# Patient Record
Sex: Male | Born: 1990 | Race: White | Hispanic: No | Smoking: Former smoker
Health system: Southern US, Community
[De-identification: ages and names within clinical notes are randomized; demographics above are authoritative.]

## PROBLEM LIST (undated history)

## (undated) DIAGNOSIS — Z9889 Other specified postprocedural states: Secondary | ICD-10-CM

## (undated) DIAGNOSIS — K589 Irritable bowel syndrome without diarrhea: Secondary | ICD-10-CM

## (undated) DIAGNOSIS — T8859XA Other complications of anesthesia, initial encounter: Secondary | ICD-10-CM

## (undated) DIAGNOSIS — F845 Asperger's syndrome: Secondary | ICD-10-CM

## (undated) DIAGNOSIS — S060XAA Concussion with loss of consciousness status unknown, initial encounter: Secondary | ICD-10-CM

## (undated) DIAGNOSIS — F419 Anxiety disorder, unspecified: Secondary | ICD-10-CM

## (undated) DIAGNOSIS — S060X9A Concussion with loss of consciousness of unspecified duration, initial encounter: Secondary | ICD-10-CM

## (undated) DIAGNOSIS — K5090 Crohn's disease, unspecified, without complications: Secondary | ICD-10-CM

## (undated) HISTORY — PX: TONSILLECTOMY: SUR1361

---
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
## 2012-09-17 ENCOUNTER — Emergency Department (HOSPITAL_BASED_OUTPATIENT_CLINIC_OR_DEPARTMENT_OTHER): Payer: Worker's Compensation

## 2012-09-17 ENCOUNTER — Encounter (HOSPITAL_BASED_OUTPATIENT_CLINIC_OR_DEPARTMENT_OTHER): Payer: Self-pay | Admitting: *Deleted

## 2012-09-17 ENCOUNTER — Emergency Department (HOSPITAL_BASED_OUTPATIENT_CLINIC_OR_DEPARTMENT_OTHER)
Admission: EM | Admit: 2012-09-17 | Discharge: 2012-09-17 | Disposition: A | Discharge status: Home / Self Care | Payer: Worker's Compensation | Attending: Emergency Medicine | Admitting: Emergency Medicine

## 2012-09-17 DIAGNOSIS — W010XXA Fall on same level from slipping, tripping and stumbling without subsequent striking against object, initial encounter: Secondary | ICD-10-CM | POA: Insufficient documentation

## 2012-09-17 DIAGNOSIS — S5000XA Contusion of unspecified elbow, initial encounter: Secondary | ICD-10-CM | POA: Insufficient documentation

## 2012-09-17 DIAGNOSIS — Z8719 Personal history of other diseases of the digestive system: Secondary | ICD-10-CM | POA: Insufficient documentation

## 2012-09-17 DIAGNOSIS — F17200 Nicotine dependence, unspecified, uncomplicated: Secondary | ICD-10-CM | POA: Insufficient documentation

## 2012-09-17 DIAGNOSIS — Y9302 Activity, running: Secondary | ICD-10-CM | POA: Insufficient documentation

## 2012-09-17 DIAGNOSIS — T148XXA Other injury of unspecified body region, initial encounter: Secondary | ICD-10-CM

## 2012-09-17 DIAGNOSIS — IMO0002 Reserved for concepts with insufficient information to code with codable children: Secondary | ICD-10-CM | POA: Insufficient documentation

## 2012-09-17 DIAGNOSIS — Y9289 Other specified places as the place of occurrence of the external cause: Secondary | ICD-10-CM | POA: Insufficient documentation

## 2012-09-17 HISTORY — DX: Crohn's disease, unspecified, without complications: K50.90

## 2012-09-17 MED ORDER — HYDROCODONE-ACETAMINOPHEN 5-325 MG PO TABS: 2.0000 | ORAL_TABLET | ORAL | Status: DC | PRN

## 2012-09-17 NOTE — ED Notes (Signed)
Patient transported to X-ray 

## 2012-09-17 NOTE — ED Notes (Signed)
Pt c/o fall on concrete injury left elbow and head injury x 1 hr ago

## 2012-09-17 NOTE — ED Provider Notes (Signed)
History     CSN: [id number]  Arrival date & time 09/17/12  [place]   First MD Initiated Contact with Patient 09/17/12 1903      Chief Complaint  Patient presents with  . Fall    (Consider location/radiation/quality/duration/timing/severity/associated sxs/prior treatment) HPI Comments: Patient presents to the ER for evaluation of left elbow injury. Patient reports that he was running after a dog, slipped and fell on concrete. He is complaining of pain in the elbow, especially with movement. Pain is moderate to severe. He denied his head but there was no loss of consciousness. Patient denies any numbness or tingling. There is no neck or back pain.  Patient is a 22 y.o. male presenting with fall.  Fall    Past Medical History  Diagnosis Date  . Crohn disease     Past Surgical History  Procedure Laterality Date  . Tonsillectomy      History reviewed. No pertinent family history.  History  Substance Use Topics  . Smoking status: Current Every Day Smoker -- 0.50 packs/day    Types: Cigarettes  . Smokeless tobacco: Not on file  . Alcohol Use: No      Review of Systems  HENT: Negative for neck pain.   Musculoskeletal: Positive for arthralgias. Negative for back pain.  Skin: Positive for wound.  Neurological: Negative.   All other systems reviewed and are negative.    Allergies  Review of patient's allergies indicates no known allergies.  Home Medications   Current Outpatient Rx  Name  Route  Sig  Dispense  Refill  . montelukast (SINGULAIR) 10 MG tablet   Oral   Take 10 mg by mouth at bedtime.           BP 140/89  Pulse 76  Temp(Src) 99.9 F (37.7 C) (Oral)  Resp 16  Ht 6\' 3"  (1.905 m)  Wt 255 lb (115.667 kg)  BMI 31.87 kg/m2  SpO2 99%  Physical Exam  Constitutional: He is oriented to person, place, and time. He appears well-developed and well-nourished. No distress.  HENT:  Head: Normocephalic and atraumatic.  Right Ear: Hearing normal.  Left  Ear: Hearing normal.  Nose: Nose normal.  Mouth/Throat: Oropharynx is clear and moist and mucous membranes are normal.  Eyes: Conjunctivae and EOM are normal. Pupils are equal, round, and reactive to light.  Neck: Normal range of motion. Neck supple.  Cardiovascular: Regular rhythm, S1 normal and S2 normal.  Exam reveals no gallop and no friction rub.   No murmur heard. Pulmonary/Chest: Effort normal and breath sounds normal. No respiratory distress. He exhibits no tenderness.  Abdominal: Soft. Normal appearance and bowel sounds are normal. There is no hepatosplenomegaly. There is no tenderness. There is no rebound, no guarding, no tenderness at McBurney's point and negative Murphy's sign. No hernia.  Musculoskeletal:       Left elbow: He exhibits decreased range of motion and swelling. He exhibits no effusion, no deformity and no laceration. Tenderness found. Lateral epicondyle and olecranon process tenderness noted.  Neurological: He is alert and oriented to person, place, and time. He has normal strength. No cranial nerve deficit or sensory deficit. Coordination normal. GCS eye subscore is 4. GCS verbal subscore is 5. GCS motor subscore is 6.  Skin: Skin is warm, dry and intact. No rash noted. No cyanosis.     Psychiatric: He has a normal mood and affect. His speech is normal and behavior is normal. Thought content normal.    ED Course  Procedures (  including critical care time)  Labs Reviewed - No data to display Dg Elbow Complete Left  09/17/2012  *RADIOLOGY REPORT*  Clinical Data: Trauma, left elbow pain  LEFT ELBOW - COMPLETE 3+ VIEW  Comparison: None.  Findings: No fracture or dislocation.  No soft tissue abnormality. No radiopaque foreign body.  No joint effusion.  IMPRESSION: No fracture identified.   Original Report Authenticated By: Christiana Pellant, M.D.      Diagnosis: Abrasion and contusion left elbow    MDM  Patient presents to the ER for evaluation of left elbow injury.  He does have a very superficial small abrasion just distal to the elbow. Patient is complaining of pain in this region and in the elbow region, especially movement. No deformity noted. X-ray did not show any fracture, dislocation. There is no joint effusion. Patient counseled that if he does not have any improvement in 1 week, should return to see his doctor for repeat x-ray. Otherwise will treat with rest and analgesia.        Gilda Crease, MD 09/17/12 2018

## 2014-05-18 ENCOUNTER — Encounter: Payer: Self-pay | Admitting: Physician Assistant

## 2014-05-18 ENCOUNTER — Ambulatory Visit (INDEPENDENT_AMBULATORY_CARE_PROVIDER_SITE_OTHER): Payer: Managed Care, Other (non HMO) | Admitting: Emergency Medicine

## 2014-05-18 VITALS — BP 120/68 | HR 54 | Temp 98.3°F | Resp 16 | Ht 74.0 in | Wt 197.0 lb

## 2014-05-18 DIAGNOSIS — R001 Bradycardia, unspecified: Secondary | ICD-10-CM

## 2014-05-18 DIAGNOSIS — R634 Abnormal weight loss: Secondary | ICD-10-CM

## 2014-05-18 DIAGNOSIS — R55 Syncope and collapse: Secondary | ICD-10-CM

## 2014-05-18 LAB — COMPREHENSIVE METABOLIC PANEL
ALBUMIN: 5.1 g/dL (ref 3.5–5.2)
ALT: 20 U/L (ref 0–53)
AST: 19 U/L (ref 0–37)
Alkaline Phosphatase: 53 U/L (ref 39–117)
BILIRUBIN TOTAL: 1.4 mg/dL — AB (ref 0.2–1.2)
BUN: 15 mg/dL (ref 6–23)
CALCIUM: 10.3 mg/dL (ref 8.4–10.5)
CO2: 30 meq/L (ref 19–32)
CREATININE: 0.93 mg/dL (ref 0.50–1.35)
Chloride: 105 mEq/L (ref 96–112)
Glucose, Bld: 83 mg/dL (ref 70–99)
Potassium: 4 mEq/L (ref 3.5–5.3)
Sodium: 143 mEq/L (ref 135–145)
TOTAL PROTEIN: 7.6 g/dL (ref 6.0–8.3)

## 2014-05-18 LAB — POCT CBC
Granulocyte percent: 60.3 %G (ref 37–80)
HCT, POC: 45.7 % (ref 43.5–53.7)
HEMOGLOBIN: 15.4 g/dL (ref 14.1–18.1)
Lymph, poc: 1.9 (ref 0.6–3.4)
MCH, POC: 31 pg (ref 27–31.2)
MCHC: 33.7 g/dL (ref 31.8–35.4)
MCV: 91.9 fL (ref 80–97)
MID (cbc): 0.4 (ref 0–0.9)
MPV: 6.8 fL (ref 0–99.8)
PLATELET COUNT, POC: 229 10*3/uL (ref 142–424)
POC Granulocyte: 3.4 (ref 2–6.9)
POC LYMPH PERCENT: 33.2 %L (ref 10–50)
POC MID %: 6.5 % (ref 0–12)
RBC: 4.98 M/uL (ref 4.69–6.13)
RDW, POC: 12.6 %
WBC: 5.6 10*3/uL (ref 4.6–10.2)

## 2014-05-18 LAB — POCT URINALYSIS DIPSTICK
Bilirubin, UA: NEGATIVE
Blood, UA: NEGATIVE
Glucose, UA: NEGATIVE
Leukocytes, UA: NEGATIVE
Nitrite, UA: NEGATIVE
PH UA: 5.5
PROTEIN UA: NEGATIVE
UROBILINOGEN UA: 0.2

## 2014-05-18 LAB — POCT GLYCOSYLATED HEMOGLOBIN (HGB A1C): HEMOGLOBIN A1C: 4.6

## 2014-05-18 LAB — GLUCOSE, POCT (MANUAL RESULT ENTRY): POC Glucose: 89 mg/dl (ref 70–99)

## 2014-05-18 LAB — TSH: TSH: 0.799 u[IU]/mL (ref 0.350–4.500)

## 2014-05-18 NOTE — Progress Notes (Signed)
IDENTIFYING INFORMATION  Theodore Meza / DOB: 01/30/1991 / MRN: [id number]  The patient  does not have a problem list on file.  SUBJECTIVE  CC: Bradycardia   HPI: [name] is a 24 y.o. y.o. male presenting for a workers comp injury. It was learned during that interview that he has passed out roughly 6 times in the last six months. Three of which occurred while he was working, and three while he was at home. He reports this is new for him.  He descbies the episodes as "seeing a bright light" and then starts to become dizzy, which last roughly for one minute, and then he passes out.  He timed one episode and reports a 10 min blackout. He reports these episodes are usually precipitated by sweating caused by physical activity. He denies chest pain with the episodes.  He denies merciful syncope and hx of panic attack, and does not feel anxious before these episode.  He also reports an unintentional weight loss 57 lbs in the last year.  He has a history of stomach problems which led to a negative colonoscopy.  He denies hematemesis. he denies participating in a physical activity program at this time. He denies nausea and vomiting at the time. He reports drinking greater than 64 ounces of fluid daily.    He reports daily 5/10 abdominal cramplike abdominal pain.  He moves his bowels daily and reports diarrhea most of the time. If he does not have diarrhea then he will be constipated.  The last time he had no abdominal pain was for a few days in November.  He reports a negative GI workup in the past that included "a whole bunch of tests." That information is not documented in epic.     He  has a past medical history of Crohn disease.    He currently has no medications in their medication list.  [name] is allergic to dicyclomine. He  reports that he has been smoking Cigarettes.  He has a 1 pack-year smoking history. He does not have any smokeless tobacco history on file. He reports that he does not drink  alcohol or use illicit drugs. He  reports that he does not engage in sexual activity.  The patient  has past surgical history that includes Tonsillectomy.  His family history is not on file.  Review of Systems  Constitutional: Positive for weight loss. Negative for fever, chills, malaise/fatigue and diaphoresis.  Respiratory: Negative for cough and shortness of breath.   Cardiovascular: Negative for chest pain and palpitations.  Gastrointestinal: Positive for nausea (with episodic syncope), abdominal pain (chronic), diarrhea and constipation.  Genitourinary: Negative.   Skin: Negative.   Neurological: Negative for dizziness, weakness and headaches.    OBJECTIVE  Blood pressure 120/68, pulse 54, temperature 98.3 F (36.8 C), resp. rate 16, height  (1.88 m), weight 197 lb (89.359 kg), SpO2 100 %. The patient's body mass index is 25.28 kg/(m^2).  Physical Exam  Constitutional: He is oriented to person, place, and time. He appears well-developed and well-nourished.  Neck: No thyromegaly present.  Cardiovascular: Normal pulses.  A regularly irregular rhythm present.  No extrasystoles are present. Bradycardia present.   No murmur heard. Respiratory: Breath sounds normal. He has no decreased breath sounds. He has no wheezes. He has no rhonchi. He has no rales.  GI: Soft. Bowel sounds are normal.  Musculoskeletal: Normal range of motion.  Neurological: He is alert and oriented to person, place, and time. He  has normal strength. He displays no atrophy, no tremor and normal reflexes. No cranial nerve deficit or sensory deficit. He exhibits normal muscle tone. He displays no seizure activity. Coordination and gait normal.  Skin: Skin is warm and dry. No pallor.  Psychiatric: He has a normal mood and affect. His behavior is normal. Judgment and thought content normal.   Orthostatic VS for the past 24 hrs:  BP- Lying Pulse- Lying BP- Sitting Pulse- Sitting BP- Standing at 0 minutes Pulse-  Standing at 0 minutes  05/18/14 1355 129/78 mmHg 60 121/77 mmHg 63 121/77 mmHg 69   Results for orders placed or performed in visit on 05/18/14 (from the past 24 hour(s))  POCT CBC     Status: None   Collection Time: 05/18/14  1:54 PM  Result Value Ref Range   WBC 5.6 4.6 - 10.2 K/uL   Lymph, poc 1.9 0.6 - 3.4   POC LYMPH PERCENT 33.2 10 - 50 %L   MID (cbc) 0.4 0 - 0.9   POC MID % 6.5 0 - 12 %M   POC Granulocyte 3.4 2 - 6.9   Granulocyte percent 60.3 37 - 80 %G   RBC 4.98 4.69 - 6.13 M/uL   Hemoglobin 15.4 14.1 - 18.1 g/dL   HCT, POC 40.945.7 81.143.5 - 53.7 %   MCV 91.9 80 - 97 fL   MCH, POC 31.0 27 - 31.2 pg   MCHC 33.7 31.8 - 35.4 g/dL   RDW, POC 91.412.6 %   Platelet Count, POC 229 142 - 424 K/uL   MPV 6.8 0 - 99.8 fL  POCT glucose (manual entry)     Status: None   Collection Time: 05/18/14  1:54 PM  Result Value Ref Range   POC Glucose 89 70 - 99 mg/dl  POCT glycosylated hemoglobin (Hb A1C)     Status: None   Collection Time: 05/18/14  1:54 PM  Result Value Ref Range   Hemoglobin A1C 4.6   POCT urinalysis dipstick     Status: None   Collection Time: 05/18/14  2:17 PM  Result Value Ref Range   Color, UA Yellow    Clarity, UA Clear    Glucose, UA neg    Bilirubin, UA neg    Ketones, UA trace    Spec Grav, UA >=1.030    Blood, UA neg    pH, UA 5.5    Protein, UA neg    Urobilinogen, UA 0.2    Nitrite, UA neg    Leukocytes, UA Negative     ASSESSMENT & PLAN  Theodore Meza was seen today for bradycardia.  Diagnoses and associated orders for this visit:  Bradycardia - TSH - EKG 12-Lead - Ambulatory referral to Cardiology  Syncope and collapse - POCT CBC - POCT glucose (manual entry) - POCT glycosylated hemoglobin (Hb A1C) - POCT urinalysis dipstick - Comprehensive metabolic panel - EKG 12-Lead - Ambulatory referral to Cardiology  Unintentional weight loss - HIV antibody     The patient was instructed to to call or comeback to clinic as needed, or should symptoms  warrant.  Theodore Meza, MHS, PA-C [facility] 05/18/2014 3:09 PM

## 2014-05-19 LAB — HIV ANTIBODY (ROUTINE TESTING W REFLEX): HIV 1&2 Ab, 4th Generation: NONREACTIVE

## 2015-01-11 ENCOUNTER — Emergency Department (HOSPITAL_BASED_OUTPATIENT_CLINIC_OR_DEPARTMENT_OTHER): Payer: Worker's Compensation

## 2015-01-11 ENCOUNTER — Emergency Department (HOSPITAL_BASED_OUTPATIENT_CLINIC_OR_DEPARTMENT_OTHER)
Admission: EM | Admit: 2015-01-11 | Discharge: 2015-01-11 | Disposition: A | Discharge status: Home / Self Care | Payer: Worker's Compensation | Attending: Emergency Medicine | Admitting: Emergency Medicine

## 2015-01-11 ENCOUNTER — Encounter (HOSPITAL_BASED_OUTPATIENT_CLINIC_OR_DEPARTMENT_OTHER): Payer: Self-pay | Admitting: *Deleted

## 2015-01-11 DIAGNOSIS — Y9289 Other specified places as the place of occurrence of the external cause: Secondary | ICD-10-CM | POA: Insufficient documentation

## 2015-01-11 DIAGNOSIS — Y9389 Activity, other specified: Secondary | ICD-10-CM | POA: Diagnosis not present

## 2015-01-11 DIAGNOSIS — Z720 Tobacco use: Secondary | ICD-10-CM | POA: Diagnosis not present

## 2015-01-11 DIAGNOSIS — Z8719 Personal history of other diseases of the digestive system: Secondary | ICD-10-CM | POA: Insufficient documentation

## 2015-01-11 DIAGNOSIS — Y990 Civilian activity done for income or pay: Secondary | ICD-10-CM | POA: Insufficient documentation

## 2015-01-11 DIAGNOSIS — S060X0A Concussion without loss of consciousness, initial encounter: Secondary | ICD-10-CM | POA: Diagnosis not present

## 2015-01-11 DIAGNOSIS — S0990XA Unspecified injury of head, initial encounter: Secondary | ICD-10-CM | POA: Diagnosis present

## 2015-01-11 DIAGNOSIS — W548XXA Other contact with dog, initial encounter: Secondary | ICD-10-CM | POA: Diagnosis not present

## 2015-01-11 HISTORY — DX: Concussion with loss of consciousness of unspecified duration, initial encounter: S06.0X9A

## 2015-01-11 HISTORY — DX: Concussion with loss of consciousness status unknown, initial encounter: S06.0XAA

## 2015-01-11 NOTE — Discharge Instructions (Signed)
Tylenol or ibuprofen as needed for headache.  Return to the emergency department if you experience any new or concerning symptoms.   Concussion A concussion, or closed-head injury, is a brain injury caused by a direct blow to the head or by a quick and sudden movement (jolt) of the head or neck. Concussions are usually not life-threatening. Even so, the effects of a concussion can be serious. If you have had a concussion before, you are more likely to experience concussion-like symptoms after a direct blow to the head.  CAUSES  Direct blow to the head, such as from running into another player during a soccer game, being hit in a fight, or hitting your head on a hard surface.  A jolt of the head or neck that causes the brain to move back and forth inside the skull, such as in a car crash. SIGNS AND SYMPTOMS The signs of a concussion can be hard to notice. Early on, they may be missed by you, family members, and health care providers. You may look fine but act or feel differently. Symptoms are usually temporary, but they may last for days, weeks, or even longer. Some symptoms may appear right away while others may not show up for hours or days. Every head injury is different. Symptoms include:  Mild to moderate headaches that will not go away.  A feeling of pressure inside your head.  Having more trouble than usual:  Learning or remembering things you have heard.  Answering questions.  Paying attention or concentrating.  Organizing daily tasks.  Making decisions and solving problems.  Slowness in thinking, acting or reacting, speaking, or reading.  Getting lost or being easily confused.  Feeling tired all the time or lacking energy (fatigued).  Feeling drowsy.  Sleep disturbances.  Sleeping more than usual.  Sleeping less than usual.  Trouble falling asleep.  Trouble sleeping (insomnia).  Loss of balance or feeling lightheaded or dizzy.  Nausea or  vomiting.  Numbness or tingling.  Increased sensitivity to:  Sounds.  Lights.  Distractions.  Vision problems or eyes that tire easily.  Diminished sense of taste or smell.  Ringing in the ears.  Mood changes such as feeling sad or anxious.  Becoming easily irritated or angry for little or no reason.  Lack of motivation.  Seeing or hearing things other people do not see or hear (hallucinations). DIAGNOSIS Your health care provider can usually diagnose a concussion based on a description of your injury and symptoms. He or she will ask whether you passed out (lost consciousness) and whether you are having trouble remembering events that happened right before and during your injury. Your evaluation might include:  A brain scan to look for signs of injury to the brain. Even if the test shows no injury, you may still have a concussion.  Blood tests to be sure other problems are not present. TREATMENT  Concussions are usually treated in an emergency department, in urgent care, or at a clinic. You may need to stay in the hospital overnight for further treatment.  Tell your health care provider if you are taking any medicines, including prescription medicines, over-the-counter medicines, and natural remedies. Some medicines, such as blood thinners (anticoagulants) and aspirin, may increase the chance of complications. Also tell your health care provider whether you have had alcohol or are taking illegal drugs. This information may affect treatment.  Your health care provider will send you home with important instructions to follow.  How fast you will recover  from a concussion depends on many factors. These factors include how severe your concussion is, what part of your brain was injured, your age, and how healthy you were before the concussion.  Most people with mild injuries recover fully. Recovery can take time. In general, recovery is slower in older persons. Also, persons who  have had a concussion in the past or have other medical problems may find that it takes longer to recover from their current injury. HOME CARE INSTRUCTIONS General Instructions  Carefully follow the directions your health care provider gave you.  Only take over-the-counter or prescription medicines for pain, discomfort, or fever as directed by your health care provider.  Take only those medicines that your health care provider has approved.  Do not drink alcohol until your health care provider says you are well enough to do so. Alcohol and certain other drugs may slow your recovery and can put you at risk of further injury.  If it is harder than usual to remember things, write them down.  If you are easily distracted, try to do one thing at a time. For example, do not try to watch TV while fixing dinner.  Talk with family members or close friends when making important decisions.  Keep all follow-up appointments. Repeated evaluation of your symptoms is recommended for your recovery.  Watch your symptoms and tell others to do the same. Complications sometimes occur after a concussion. Older adults with a brain injury may have a higher risk of serious complications, such as a blood clot on the brain.  Tell your teachers, school nurse, school counselor, coach, athletic trainer, or work Production designer, theatre/television/filmmanager about your injury, symptoms, and restrictions. Tell them about what you can or cannot do. They should watch for:  Increased problems with attention or concentration.  Increased difficulty remembering or learning new information.  Increased time needed to complete tasks or assignments.  Increased irritability or decreased ability to cope with stress.  Increased symptoms.  Rest. Rest helps the brain to heal. Make sure you:  Get plenty of sleep at night. Avoid staying up late at night.  Keep the same bedtime hours on weekends and weekdays.  Rest during the day. Take daytime naps or rest breaks  when you feel tired.  Limit activities that require a lot of thought or concentration. These include:  Doing homework or job-related work.  Watching TV.  Working on the computer.  Avoid any situation where there is potential for another head injury (football, hockey, soccer, basketball, martial arts, downhill snow sports and horseback riding). Your condition will get worse every time you experience a concussion. You should avoid these activities until you are evaluated by the appropriate follow-up health care providers. Returning To Your Regular Activities You will need to return to your normal activities slowly, not all at once. You must give your body and brain enough time for recovery.  Do not return to sports or other athletic activities until your health care provider tells you it is safe to do so.  Ask your health care provider when you can drive, ride a bicycle, or operate heavy machinery. Your ability to react may be slower after a brain injury. Never do these activities if you are dizzy.  Ask your health care provider about when you can return to work or school. Preventing Another Concussion It is very important to avoid another brain injury, especially before you have recovered. In rare cases, another injury can lead to permanent brain damage, brain swelling, or  death. The risk of this is greatest during the first 7-10 days after a head injury. Avoid injuries by:  Wearing a seat belt when riding in a car.  Drinking alcohol only in moderation.  Wearing a helmet when biking, skiing, skateboarding, skating, or doing similar activities.  Avoiding activities that could lead to a second concussion, such as contact or recreational sports, until your health care provider says it is okay.  Taking safety measures in your home.  Remove clutter and tripping hazards from floors and stairways.  Use grab bars in bathrooms and handrails by stairs.  Place non-slip mats on floors and in  bathtubs.  Improve lighting in dim areas. SEEK MEDICAL CARE IF:  You have increased problems paying attention or concentrating.  You have increased difficulty remembering or learning new information.  You need more time to complete tasks or assignments than before.  You have increased irritability or decreased ability to cope with stress.  You have more symptoms than before. Seek medical care if you have any of the following symptoms for more than 2 weeks after your injury:  Lasting (chronic) headaches.  Dizziness or balance problems.  Nausea.  Vision problems.  Increased sensitivity to noise or light.  Depression or mood swings.  Anxiety or irritability.  Memory problems.  Difficulty concentrating or paying attention.  Sleep problems.  Feeling tired all the time. SEEK IMMEDIATE MEDICAL CARE IF:  You have severe or worsening headaches. These may be a sign of a blood clot in the brain.  You have weakness (even if only in one hand, leg, or part of the face).  You have numbness.  You have decreased coordination.  You vomit repeatedly.  You have increased sleepiness.  One pupil is larger than the other.  You have convulsions.  You have slurred speech.  You have increased confusion. This may be a sign of a blood clot in the brain.  You have increased restlessness, agitation, or irritability.  You are unable to recognize people or places.  You have neck pain.  It is difficult to wake you up.  You have unusual behavior changes.  You lose consciousness. MAKE SURE YOU:  Understand these instructions.  Will watch your condition.  Will get help right away if you are not doing well or get worse. Document Released: 07/19/2003 Document Revised: 05/03/2013 Document Reviewed: 11/18/2012 [facility] Patient Information 2015 [place], [place]. This information is not intended to replace advice given to you by your health care provider. Make sure you discuss any  questions you have with your health care provider.

## 2015-01-11 NOTE — ED Notes (Signed)
Pt d/c home with note for school given
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
## 2016-08-04 DIAGNOSIS — F411 Generalized anxiety disorder: Secondary | ICD-10-CM | POA: Insufficient documentation

## 2019-03-15 ENCOUNTER — Encounter: Payer: Self-pay | Admitting: Adult Health

## 2019-03-15 ENCOUNTER — Other Ambulatory Visit: Payer: Self-pay

## 2019-03-15 ENCOUNTER — Ambulatory Visit (INDEPENDENT_AMBULATORY_CARE_PROVIDER_SITE_OTHER): Payer: 59 | Admitting: Adult Health

## 2019-03-15 DIAGNOSIS — F4310 Post-traumatic stress disorder, unspecified: Secondary | ICD-10-CM

## 2019-03-15 DIAGNOSIS — G4700 Insomnia, unspecified: Secondary | ICD-10-CM

## 2019-03-15 DIAGNOSIS — F411 Generalized anxiety disorder: Secondary | ICD-10-CM

## 2019-03-15 DIAGNOSIS — F331 Major depressive disorder, recurrent, moderate: Secondary | ICD-10-CM | POA: Diagnosis not present

## 2019-03-15 DIAGNOSIS — F429 Obsessive-compulsive disorder, unspecified: Secondary | ICD-10-CM | POA: Diagnosis not present

## 2019-03-15 MED ORDER — FLUOXETINE HCL 20 MG PO CAPS: 20.0000 mg | ORAL_CAPSULE | Freq: Every day | ORAL | 3 refills | Status: DC

## 2019-03-15 MED ORDER — HYDROXYZINE HCL 25 MG PO TABS: 25.0000 mg | ORAL_TABLET | Freq: Three times a day (TID) | ORAL | 2 refills | Status: AC | PRN

## 2019-03-15 NOTE — Progress Notes (Signed)
[facility] MD/PA/NP Initial Note  03/15/2019 4:48 PM Theodore Meza  MRN:  [id number]  Chief Complaint:   HPI:   Describes mood today as "so-so". Pleasant. Tearful at times. Moods "up and down" - constant worry and rumination with some periods of "normalcy". Mood symptoms - some depression and irritability. More anxious overall. Doesn't feel "passion or joy". Symptoms started several years ago. Struggles more some times than others. Trying to use therapeutic techniques to help manage it. Has taken Lexapro in the past but does not recall how it worked. Stable interest and motivation.Taking medications as prescribed.  Energy levels stable. Active, has a regular exercise routine. Walking 60 minutes a day. Works full-time as a IT consultant - x 4 years. Enjoys some usual interests and activities. Lives alone. Has friends. Father local but does not see often. Mostly by myself.  Appetite varies - nothing normal for 4 to 5 years - lost 100 pounds in 2015 - IBS. Weight stable. Sleeps better some nights than others. Easier to fall asleep and can't asleep. Has times when he has things on his mind. Worried - "worries about things I don't even have a reason too. Stating "it keeps me up a lot of nights". Averages 4 hours a night. Focus and concentration stable. Completing tasks. Managing aspects of household. Work going "really good". Feels like his mind is calmer at work. Stating "being productive keeps me from worrying so much".  Denies SI or HI. Denies AH or VH.  Previous medications: Lexapro  Visit Diagnosis:    ICD-10-CM   1. Insomnia, unspecified type  G47.00   2. Obsessive-compulsive disorder, unspecified type  F42.9   3. Generalized anxiety disorder  F41.1   4. Major depressive disorder, recurrent episode, moderate (HCC)  F33.1   5. PTSD (post-traumatic stress disorder)  F43.10     Past Psychiatric History: Denies psychiatric hospitalizations. Seen a therapist at age 28 after parents divorced - also had  some friends that has passed away.  Past Medical History:  Past Medical History:  Diagnosis Date  . Concussion   . Crohn disease [facility])     Past Surgical History:  Procedure Laterality Date  . TONSILLECTOMY      Family Psychiatric History: Father - BPD, Mother - uncertain  Family History: History reviewed. No pertinent family history.  Social History:  Social History   Socioeconomic History  . Marital status: Single    Spouse name: Not on file  . Number of children: Not on file  . Years of education: Not on file  . Highest education level: Not on file  Occupational History  . Not on file  Social Needs  . Financial resource strain: Not on file  . Food insecurity    Worry: Not on file    Inability: Not on file  . Transportation needs    Medical: Not on file    Non-medical: Not on file  Tobacco Use  . Smoking status: Former Smoker    Packs/day: 0.50    Years: 2.00    Pack years: 1.00    Types: Cigarettes  . Smokeless tobacco: Never Used  Substance and Sexual Activity  . Alcohol use: Not Currently    Alcohol/week: 0.0 standard drinks    Comment: 6/week  . Drug use: Not Currently  . Sexual activity: Not on file  Lifestyle  . Physical activity    Days per week: Not on file    Minutes per session: Not on file  . Stress: Not on file  Relationships  . Social Herbalist on phone: Not on file    Gets together: Not on file    Attends religious service: Not on file    Active member of club or organization: Not on file    Attends meetings of clubs or organizations: Not on file    Relationship status: Not on file  Other Topics Concern  . Not on file  Social History Narrative  . Not on file    Allergies:  Allergies  Allergen Reactions  . Dicyclomine     Metabolic Disorder Labs: Lab Results  Component Value Date   HGBA1C 4.6 05/18/2014   No results found for: PROLACTIN No results found for: CHOL, TRIG, HDL, CHOLHDL, VLDL, LDLCALC Lab Results   Component Value Date   TSH 0.799 05/18/2014    Therapeutic Level Labs: No results found for: LITHIUM No results found for: VALPROATE No components found for:  CBMZ  Current Medications: No current outpatient medications on file.   No current facility-administered medications for this visit.     Medication Side Effects: none  Orders placed this visit:  No orders of the defined types were placed in this encounter.   Psychiatric Specialty Exam:  ROS  There were no vitals taken for this visit.There is no height or weight on file to calculate BMI.  General Appearance: Neat and Well Groomed  Eye Contact:  Good  Speech:  Clear and Coherent  Volume:  Normal  Mood:  Anxious, Depressed and Irritable  Affect:  Appropriate and Congruent  Thought Process:  Coherent  Orientation:  Full (Time, Place, and Person)  Thought Content: Logical   Suicidal Thoughts:  No  Homicidal Thoughts:  No  Memory:  WNL  Judgement:  Good  Insight:  Good  Psychomotor Activity:  Normal  Concentration:  Concentration: Good  Recall:  Good  Fund of Knowledge: Good  Language: Good  Assets:  Communication Skills Desire for Improvement Financial Resources/Insurance Housing Intimacy Leisure Time Physical Health Resilience Social Support Talents/Skills Transportation Vocational/Educational  ADL's:  Intact  Cognition: WNL  Prognosis:  Good   Screenings: None  Receiving Psychotherapy: No - history of - none currently.  Treatment Plan/Recommendations:  Plan:  1. Add Prozac 20mg  daily   Restart therapy  RTC 4 weeks  Patient advised to contact office with any questions, adverse effects, or acute worsening in signs and symptoms.   Aloha Gell, NP

## 2019-04-12 ENCOUNTER — Encounter: Payer: Self-pay | Admitting: Adult Health

## 2019-04-12 ENCOUNTER — Other Ambulatory Visit: Payer: Self-pay

## 2019-04-12 ENCOUNTER — Ambulatory Visit (INDEPENDENT_AMBULATORY_CARE_PROVIDER_SITE_OTHER): Payer: 59 | Admitting: Adult Health

## 2019-04-12 DIAGNOSIS — F411 Generalized anxiety disorder: Secondary | ICD-10-CM | POA: Diagnosis not present

## 2019-04-12 DIAGNOSIS — F331 Major depressive disorder, recurrent, moderate: Secondary | ICD-10-CM | POA: Diagnosis not present

## 2019-04-12 DIAGNOSIS — F4310 Post-traumatic stress disorder, unspecified: Secondary | ICD-10-CM

## 2019-04-12 DIAGNOSIS — F429 Obsessive-compulsive disorder, unspecified: Secondary | ICD-10-CM | POA: Diagnosis not present

## 2019-04-12 MED ORDER — FLUOXETINE HCL 40 MG PO CAPS: 40.0000 mg | ORAL_CAPSULE | Freq: Every day | ORAL | 1 refills | Status: AC

## 2019-04-12 NOTE — Progress Notes (Signed)
[facility] MD/PA/NP Medication Check  04/12/2019 5:17 PM Theodore Meza  MRN:  [id number]  Chief Complaint:   HPI:   Describes mood today as "ok". Pleasant. Denies tearfulness. Mood "more level". Denies depression or irritability. Doing "better" overall. Has seen "progress, but is still struggling". Continues to experience anxiety - obsessive tendencies. Stating "once I get anxious, I don't know how to tell anyone how I feel". Still makes "[place]".   Not as "worried" about things. Was supposed to fly to [place] for Thanksgiving and didn't want to go out of "fear". Right before leaving, found out 3 family members tested positive for Covid-19.Marland Kitchen Had been very "anxious" about flying - "I didn't know if I could do it, and then I didn't have too". Feels himself headed in a better direction. Stable interest and motivation.Taking medications as prescribed.  Energy levels stable. Active, has a regular exercise routine. Works full-time as a IT consultant - x 4 years. Enjoys some usual interests and activities. Lives alone. Talking and visiting with friends. Father local but does not have a good relationship with him.  Appetite adequate - eats one meal a day - usually in the afternoon. Weight stable. Sleeps better some nights than others. Averages 4 hours a night. No problems getting to sleep. Focus and concentration stable. Completing tasks. Managing aspects of household. Work going well.  Denies SI or HI. Denies AH or VH.  Previous medications: Lexapro  Visit Diagnosis:    ICD-10-CM   1. Obsessive-compulsive disorder, unspecified type  F42.9 FLUoxetine (PROZAC) 40 MG capsule  2. Generalized anxiety disorder  F41.1 FLUoxetine (PROZAC) 40 MG capsule  3. Major depressive disorder, recurrent episode, moderate (HCC)  F33.1 FLUoxetine (PROZAC) 40 MG capsule  4. PTSD (post-traumatic stress disorder)  F43.10 FLUoxetine (PROZAC) 40 MG capsule    Past Psychiatric History: Denies psychiatric  hospitalizations. Seen a therapist at age 23 after parents divorced - also had some friends that has passed away.  Past Medical History:  Past Medical History:  Diagnosis Date  . Concussion   . Crohn disease [facility])     Past Surgical History:  Procedure Laterality Date  . TONSILLECTOMY      Family Psychiatric History: Father - BPD, Mother - uncertain  Family History: No family history on file.  Social History:  Social History   Socioeconomic History  . Marital status: Single    Spouse name: Not on file  . Number of children: Not on file  . Years of education: Not on file  . Highest education level: Not on file  Occupational History  . Not on file  Social Needs  . Financial resource strain: Not on file  . Food insecurity    Worry: Not on file    Inability: Not on file  . Transportation needs    Medical: Not on file    Non-medical: Not on file  Tobacco Use  . Smoking status: Former Smoker    Packs/day: 0.50    Years: 2.00    Pack years: 1.00    Types: Cigarettes  . Smokeless tobacco: Never Used  Substance and Sexual Activity  . Alcohol use: Not Currently    Alcohol/week: 0.0 standard drinks    Comment: 6/week  . Drug use: Not Currently  . Sexual activity: Not on file  Lifestyle  . Physical activity    Days per week: Not on file    Minutes per session: Not on file  . Stress: Not on file  Relationships  .  Social Herbalist on phone: Not on file    Gets together: Not on file    Attends religious service: Not on file    Active member of club or organization: Not on file    Attends meetings of clubs or organizations: Not on file    Relationship status: Not on file  Other Topics Concern  . Not on file  Social History Narrative  . Not on file    Allergies:  Allergies  Allergen Reactions  . Dicyclomine     Metabolic Disorder Labs: Lab Results  Component Value Date   HGBA1C 4.6 05/18/2014   No results found for: PROLACTIN No results found  for: CHOL, TRIG, HDL, CHOLHDL, VLDL, LDLCALC Lab Results  Component Value Date   TSH 0.799 05/18/2014    Therapeutic Level Labs: No results found for: LITHIUM No results found for: VALPROATE No components found for:  CBMZ  Current Medications: Current Outpatient Medications  Medication Sig Dispense Refill  . FLUoxetine (PROZAC) 40 MG capsule Take 1 capsule (40 mg total) by mouth daily. 90 capsule 1  . hydrOXYzine (ATARAX/VISTARIL) 25 MG tablet Take 1 tablet (25 mg total) by mouth 3 (three) times daily as needed. 90 tablet 2   No current facility-administered medications for this visit.     Medication Side Effects: none  Orders placed this visit:  No orders of the defined types were placed in this encounter.   Psychiatric Specialty Exam:  ROS  There were no vitals taken for this visit.There is no height or weight on file to calculate BMI.  General Appearance: Neat and Well Groomed  Eye Contact:  Good  Speech:  Clear and Coherent  Volume:  Normal  Mood:  Anxious, Depressed and Irritable  Affect:  Appropriate and Congruent  Thought Process:  Coherent and Descriptions of Associations: Intact  Orientation:  Full (Time, Place, and Person)  Thought Content: Logical   Suicidal Thoughts:  No  Homicidal Thoughts:  No  Memory:  WNL  Judgement:  Good  Insight:  Good  Psychomotor Activity:  Normal  Concentration:  Concentration: Good  Recall:  Good  Fund of Knowledge: Good  Language: Good  Assets:  Communication Skills Desire for Improvement Financial Resources/Insurance Housing Intimacy Leisure Time Physical Health Resilience Social Support Talents/Skills Transportation Vocational/Educational  ADL's:  Intact  Cognition: WNL  Prognosis:  Good   Screenings: None  Receiving Psychotherapy: No - history of - none currently.  Treatment Plan/Recommendations:  Plan:  Increase Prozac 20mg  to 40mg  daily  Vistaril 25mg  3 x daily prn anxiety/sleep   Restart  therapy  RTC 4 weeks  Patient advised to contact office with any questions, adverse effects, or acute worsening in signs and symptoms.   Aloha Gell, NP

## 2019-05-10 ENCOUNTER — Ambulatory Visit: Payer: 59 | Admitting: Adult Health

## 2019-05-17 ENCOUNTER — Ambulatory Visit (INDEPENDENT_AMBULATORY_CARE_PROVIDER_SITE_OTHER): Payer: 59 | Admitting: Adult Health

## 2019-05-17 ENCOUNTER — Encounter: Payer: Self-pay | Admitting: Adult Health

## 2019-05-17 ENCOUNTER — Other Ambulatory Visit: Payer: Self-pay

## 2019-05-17 DIAGNOSIS — F429 Obsessive-compulsive disorder, unspecified: Secondary | ICD-10-CM

## 2019-05-17 DIAGNOSIS — F4310 Post-traumatic stress disorder, unspecified: Secondary | ICD-10-CM | POA: Diagnosis not present

## 2019-05-17 DIAGNOSIS — F411 Generalized anxiety disorder: Secondary | ICD-10-CM

## 2019-05-17 DIAGNOSIS — G4700 Insomnia, unspecified: Secondary | ICD-10-CM

## 2019-05-17 DIAGNOSIS — F331 Major depressive disorder, recurrent, moderate: Secondary | ICD-10-CM | POA: Diagnosis not present

## 2019-05-17 NOTE — Progress Notes (Signed)
[name] [id number] [birth year] 27 y.o.  Subjective:   Patient ID:  Theodore Meza is a 29 y.o. (DOB [birth year]) male.  Chief Complaint: No chief complaint on file.   HPI Theodore Meza presents to the office today for follow-up of GAD, MDD, insonia, panic attacks, and PTSD.  Describes mood today as "ok". Pleasant. Reports tearfulness. Decreased anxiety and depression. Getting irritated at times. Stating "I can tell the medication is helping". Used to couldn't go into [employer] - "now I don't even thinking about it". Decreased anxiety when he goes into a store. Typically gets nervous in social settings and worries what people are saying or thinking about him. Stating "I feel more comfortable with the Prozac". Has been able to "let go of a lot of things, but can't let go of everything". Still notices OCD symptoms. Doesn't like "foyer" light on. Has a blanket covering couch for dog and cat. Likes the blanket to be straight and tucked in nice and neat. If coffee table gets cluttered, that bothers me. Stating "I try not to show my feelings and say something I shouldn't". Gets irritated and shuts down. Stable interest and motivation.Taking medications as prescribed.  Energy levels stable. Active, has a regular exercise routine - walking 60 minutes. Works full-time as a Radio broadcast assistant - x 4 years. Planning to return to school. Meeting with guidance counselor at [employer].  Enjoys some usual interests and activities. Lives alone - cat and a dog. Talking and visiting with friends. Playing video games with friends. Talking to father more lately - "baby steps". Appetite adequate. Weight stable. Sleeps better some nights than others. Averages 4 hours - waking up and unable to get back to sleep.  Focus and concentration stable. Completing tasks. Managing aspects of household. Work going well. Excited about returning to school. Denies SI or HI. Denies AH or VH.  Previous medications: Lexapro   Review of Systems:   Review of Systems  Musculoskeletal: Negative for gait problem.  Neurological: Negative for tremors.  Psychiatric/Behavioral:       Please refer to HPI    Medications: I have reviewed the patient's current medications.  Current Outpatient Medications  Medication Sig Dispense Refill  . FLUoxetine (PROZAC) 40 MG capsule Take 1 capsule (40 mg total) by mouth daily. 90 capsule 1  . hydrOXYzine (ATARAX/VISTARIL) 25 MG tablet Take 1 tablet (25 mg total) by mouth 3 (three) times daily as needed. 90 tablet 2   No current facility-administered medications for this visit.    Medication Side Effects: None  Allergies:  Allergies  Allergen Reactions  . Dicyclomine     Past Medical History:  Diagnosis Date  . Concussion   . Crohn disease ([place])     No family history on file.  Social History   Socioeconomic History  . Marital status: Single    Spouse name: Not on file  . Number of children: Not on file  . Years of education: Not on file  . Highest education level: Not on file  Occupational History  . Not on file  Tobacco Use  . Smoking status: Former Smoker    Packs/day: 0.50    Years: 2.00    Pack years: 1.00    Types: Cigarettes  . Smokeless tobacco: Never Used  Substance and Sexual Activity  . Alcohol use: Not Currently    Alcohol/week: 0.0 standard drinks    Comment: 6/week  . Drug use: Not Currently  . Sexual activity: Not on file  Other Topics  Concern  . Not on file  Social History Narrative  . Not on file   Social Determinants of Health   Financial Resource Strain:   . Difficulty of Paying Living Expenses: Not on file  Food Insecurity:   . Worried About Programme researcher, broadcasting/film/video in the Last Year: Not on file  . Ran Out of Food in the Last Year: Not on file  Transportation Needs:   . Lack of Transportation (Medical): Not on file  . Lack of Transportation (Non-Medical): Not on file  Physical Activity:   . Days of Exercise per Week: Not on file  . Minutes of  Exercise per Session: Not on file  Stress:   . Feeling of Stress : Not on file  Social Connections:   . Frequency of Communication with Friends and Family: Not on file  . Frequency of Social Gatherings with Friends and Family: Not on file  . Attends Religious Services: Not on file  . Active Member of Clubs or Organizations: Not on file  . Attends Banker Meetings: Not on file  . Marital Status: Not on file  Intimate Partner Violence:   . Fear of Current or Ex-Partner: Not on file  . Emotionally Abused: Not on file  . Physically Abused: Not on file  . Sexually Abused: Not on file    Past Medical History, Surgical history, Social history, and Family history were reviewed and updated as appropriate.   Please see review of systems for further details on the patient's review from today.   Objective:   Physical Exam:  There were no vitals taken for this visit.  Physical Exam Constitutional:      General: He is not in acute distress.    Appearance: He is well-developed.  Musculoskeletal:        General: No deformity.  Neurological:     Mental Status: He is alert and oriented to person, place, and time.     Coordination: Coordination normal.  Psychiatric:        Attention and Perception: Attention and perception normal. He does not perceive auditory or visual hallucinations.        Mood and Affect: Mood normal. Mood is not anxious or depressed. Affect is not labile, blunt, angry or inappropriate.        Speech: Speech normal.        Behavior: Behavior normal.        Thought Content: Thought content normal. Thought content is not paranoid or delusional. Thought content does not include homicidal or suicidal ideation. Thought content does not include homicidal or suicidal plan.        Cognition and Memory: Cognition and memory normal.        Judgment: Judgment normal.     Comments: Insight intact     Lab Review:     Component Value Date/Time   NA 143 05/18/2014  1336   K 4.0 05/18/2014 1336   CL 105 05/18/2014 1336   CO2 30 05/18/2014 1336   GLUCOSE 83 05/18/2014 1336   BUN 15 05/18/2014 1336   CREATININE 0.93 05/18/2014 1336   CALCIUM 10.3 05/18/2014 1336   PROT 7.6 05/18/2014 1336   ALBUMIN 5.1 05/18/2014 1336   AST 19 05/18/2014 1336   ALT 20 05/18/2014 1336   ALKPHOS 53 05/18/2014 1336   BILITOT 1.4 (H) 05/18/2014 1336       Component Value Date/Time   WBC 5.6 05/18/2014 1354   RBC 4.98 05/18/2014 1354  HGB 15.4 05/18/2014 1354   HCT 45.7 05/18/2014 1354   MCV 91.9 05/18/2014 1354   MCH 31.0 05/18/2014 1354   MCHC 33.7 05/18/2014 1354    No results found for: POCLITH, LITHIUM   No results found for: PHENYTOIN, PHENOBARB, VALPROATE, CBMZ   .res Assessment: Plan:    Plan:  Prozac 40mg  daily  Vistaril 25mg  3 x daily prn anxiety/sleep  Restart therapy  RTC 4 weeks  Patient advised to contact office with any questions, adverse effects, or acute worsening in signs and symptoms. Diagnoses and all orders for this visit:  Obsessive-compulsive disorder, unspecified type  Generalized anxiety disorder  Major depressive disorder, recurrent episode, moderate (HCC)  PTSD (post-traumatic stress disorder)  Insomnia, unspecified type     Please see After Visit Summary for patient specific instructions.  No future appointments.  No orders of the defined types were placed in this encounter.   -------------------------------

## 2019-06-14 ENCOUNTER — Ambulatory Visit: Payer: 59 | Admitting: Adult Health

## 2019-06-28 ENCOUNTER — Ambulatory Visit (INDEPENDENT_AMBULATORY_CARE_PROVIDER_SITE_OTHER): Payer: 59 | Admitting: Adult Health

## 2019-06-28 ENCOUNTER — Encounter: Payer: Self-pay | Admitting: Adult Health

## 2019-06-28 DIAGNOSIS — F331 Major depressive disorder, recurrent, moderate: Secondary | ICD-10-CM | POA: Diagnosis not present

## 2019-06-28 DIAGNOSIS — G4700 Insomnia, unspecified: Secondary | ICD-10-CM | POA: Diagnosis not present

## 2019-06-28 DIAGNOSIS — F429 Obsessive-compulsive disorder, unspecified: Secondary | ICD-10-CM

## 2019-06-28 DIAGNOSIS — F411 Generalized anxiety disorder: Secondary | ICD-10-CM

## 2019-06-28 DIAGNOSIS — F4310 Post-traumatic stress disorder, unspecified: Secondary | ICD-10-CM

## 2019-06-28 NOTE — Progress Notes (Signed)
Theodore Meza [id number] 1990-12-13 29 y.o.  Virtual Visit via Telephone Note  I connected with pt on 06/28/19 at  5:20 PM EST by telephone and verified that I am speaking with the correct person using two identifiers.   I discussed the limitations, risks, security and privacy concerns of performing an evaluation and management service by telephone and the availability of in person appointments. I also discussed with the patient that there may be a patient responsible charge related to this service. The patient expressed understanding and agreed to proceed.   I discussed the assessment and treatment plan with the patient. The patient was provided an opportunity to ask questions and all were answered. The patient agreed with the plan and demonstrated an understanding of the instructions.   The patient was advised to call back or seek an in-person evaluation if the symptoms worsen or if the condition fails to improve as anticipated.  I provided 30 minutes of non-face-to-face time during this encounter.  The patient was located at home.  The provider was located at [facility].   Dorothyann Gibbs, NP   Subjective:   Patient ID:  Theodore Meza is a 29 y.o. (DOB November 25, 1990) male.  Chief Complaint: No chief complaint on file.   HPI   [name] presents for follow-up of GAD, MDD, insonia, panic attacks, and PTSD.  Describes mood today as "ok". Pleasant. Tearful at times. Decreased anxiety and depression. Getting irritated at times. Stating "I've had a rough month". Doing better overall with Prozac - "making progress". Stating "prozac has helped to push me more out of my "comfort zone". Has realized how "anxious" he was. Has spent so much time alone that he is having some "sensory" issues now that he is hanging out with friends more. More sensitive to sounds and change in settings. Feels like he gets "set off" easily. Got upset with co-worker recently for trying to making him a birthday card.  Stating "it brings back a lot of bad memories". Feels like he has a difficult time understanding emotions. Has talked to mother recently about possible "autism". Mother feels like he may of had symptoms when younger, but never had him tested. Has been taking tests online - "they all say I'm autistic". Stating a lot of things upset me that should be normal. I can't understand other's emotions. Will avoid people because of emotions. Feels "socially" awkward. Stable interest and motivation.Taking medications as prescribed.  Energy levels stable. Active, has a regular exercise routine - walking 60 minutes. Works full-time as a IT consultant - x 4 years. Planning to return to school. Meeting with guidance counselor at [employer].  Enjoys some usual interests and activities. Lives alone - cat and a dog. Talking and visiting with friends. Playing video games with friends. Talking to father more lately - "baby steps". Appetite adequate. Weight stable. Sleeps better some nights than others. Averages 4 hours - waking up and unable to get back to sleep.  Focus and concentration stable. Completing tasks. Managing aspects of household. Work going well. Excited about returning to school. Denies SI or HI. Denies AH or VH.  Previous medications: Lexapro  Review of Systems:  Review of Systems  Musculoskeletal: Negative for gait problem.  Neurological: Negative for tremors.  Psychiatric/Behavioral:       Please refer to HPI    Medications: I have reviewed the patient's current medications.  Current Outpatient Medications  Medication Sig Dispense Refill  . FLUoxetine (PROZAC) 40 MG capsule Take 1 capsule (  40 mg total) by mouth daily. 90 capsule 1  . hydrOXYzine (ATARAX/VISTARIL) 25 MG tablet Take 1 tablet (25 mg total) by mouth 3 (three) times daily as needed. 90 tablet 2   No current facility-administered medications for this visit.    Medication Side Effects: None  Allergies:  Allergies  Allergen  Reactions  . Dicyclomine     Past Medical History:  Diagnosis Date  . Concussion   . Crohn disease (HCC)     No family history on file.  Social History   Socioeconomic History  . Marital status: Single    Spouse name: Not on file  . Number of children: Not on file  . Years of education: Not on file  . Highest education level: Not on file  Occupational History  . Not on file  Tobacco Use  . Smoking status: Former Smoker    Packs/day: 0.50    Years: 2.00    Pack years: 1.00    Types: Cigarettes  . Smokeless tobacco: Never Used  Substance and Sexual Activity  . Alcohol use: Not Currently    Alcohol/week: 0.0 standard drinks    Comment: 6/week  . Drug use: Not Currently  . Sexual activity: Not on file  Other Topics Concern  . Not on file  Social History Narrative  . Not on file   Social Determinants of Health   Financial Resource Strain:   . Difficulty of Paying Living Expenses: Not on file  Food Insecurity:   . Worried About Programme researcher, broadcasting/film/video in the Last Year: Not on file  . Ran Out of Food in the Last Year: Not on file  Transportation Needs:   . Lack of Transportation (Medical): Not on file  . Lack of Transportation (Non-Medical): Not on file  Physical Activity:   . Days of Exercise per Week: Not on file  . Minutes of Exercise per Session: Not on file  Stress:   . Feeling of Stress : Not on file  Social Connections:   . Frequency of Communication with Friends and Family: Not on file  . Frequency of Social Gatherings with Friends and Family: Not on file  . Attends Religious Services: Not on file  . Active Member of Clubs or Organizations: Not on file  . Attends Banker Meetings: Not on file  . Marital Status: Not on file  Intimate Partner Violence:   . Fear of Current or Ex-Partner: Not on file  . Emotionally Abused: Not on file  . Physically Abused: Not on file  . Sexually Abused: Not on file    Past Medical History, Surgical history,  Social history, and Family history were reviewed and updated as appropriate.   Please see review of systems for further details on the patient's review from today.   Objective:   Physical Exam:  There were no vitals taken for this visit.  Physical Exam Constitutional:      General: He is not in acute distress.    Appearance: He is well-developed.  Musculoskeletal:        General: No deformity.  Neurological:     Mental Status: He is alert and oriented to person, place, and time.     Coordination: Coordination normal.  Psychiatric:        Attention and Perception: Attention and perception normal. He does not perceive auditory or visual hallucinations.        Mood and Affect: Mood is anxious and depressed. Affect is not labile, blunt, angry  or inappropriate.        Speech: Speech normal.        Behavior: Behavior normal.        Thought Content: Thought content normal. Thought content is not paranoid or delusional. Thought content does not include homicidal or suicidal ideation. Thought content does not include homicidal or suicidal plan.        Cognition and Memory: Cognition and memory normal.        Judgment: Judgment normal.     Comments: Insight intact     Lab Review:     Component Value Date/Time   NA 143 05/18/2014 1336   K 4.0 05/18/2014 1336   CL 105 05/18/2014 1336   CO2 30 05/18/2014 1336   GLUCOSE 83 05/18/2014 1336   BUN 15 05/18/2014 1336   CREATININE 0.93 05/18/2014 1336   CALCIUM 10.3 05/18/2014 1336   PROT 7.6 05/18/2014 1336   ALBUMIN 5.1 05/18/2014 1336   AST 19 05/18/2014 1336   ALT 20 05/18/2014 1336   ALKPHOS 53 05/18/2014 1336   BILITOT 1.4 (H) 05/18/2014 1336       Component Value Date/Time   WBC 5.6 05/18/2014 1354   RBC 4.98 05/18/2014 1354   HGB 15.4 05/18/2014 1354   HCT 45.7 05/18/2014 1354   MCV 91.9 05/18/2014 1354   MCH 31.0 05/18/2014 1354   MCHC 33.7 05/18/2014 1354    No results found for: POCLITH, LITHIUM   No results  found for: PHENYTOIN, PHENOBARB, VALPROATE, CBMZ   .res Assessment: Plan:    Plan:  Continue Prozac 40mg  daily  Continue Vistaril 25mg  3 x daily prn anxiety/sleep  Refer to Luan Moore for evaluation - possible psychological evaluation   RTC 4 weeks  Patient advised to contact office with any questions, adverse effects, or acute worsening in signs and symptoms. Diagnoses and all orders for this visit:   Diagnoses and all orders for this visit:  Insomnia, unspecified type  PTSD (post-traumatic stress disorder)  Major depressive disorder, recurrent episode, moderate (HCC)  Obsessive-compulsive disorder, unspecified type  Generalized anxiety disorder    Please see After Visit Summary for patient specific instructions.  No future appointments.  No orders of the defined types were placed in this encounter.     -------------------------------

## 2019-07-05 ENCOUNTER — Telehealth: Payer: Self-pay | Admitting: Adult Health

## 2019-07-05 NOTE — Telephone Encounter (Signed)
error 

## 2019-07-06 ENCOUNTER — Ambulatory Visit: Payer: 59 | Admitting: Adult Health

## 2019-07-11 ENCOUNTER — Telehealth: Payer: Self-pay | Admitting: Adult Health

## 2019-07-11 NOTE — Telephone Encounter (Signed)
Pt is wanting to know is he being Ref to [facility]. He has not heard back on if he is or not.

## 2019-07-11 NOTE — Telephone Encounter (Signed)
Discussed.

## 2019-07-11 NOTE — Telephone Encounter (Signed)
Do you know where we are with this?

## 2019-07-11 NOTE — Telephone Encounter (Signed)
Pt is very upset! Thinks we hung up on him and he called 15 times on Friday per him. He needs to know who can help him.   Gina please call him today to let him know where you are at for his referral. He was never told that Dr. Farrel Demark could not do it. He feels he has been left out in the cold on this and we aren't telling him anything. I did tell him today that Dr. Farrel Demark could not do that type of testing. He appreciated that I spoke with him and at least told him that.

## 2019-07-12 NOTE — Telephone Encounter (Signed)
It was discovered that office staff did have a list of other providers for testing that pt could see. There was miscommunication from Ms. Mozingo to staff on whether to notify the patient. Pt was updated today with [facility], and Evalee Jefferson, PhD. Pt said he called [facility] already and there is an eight month wait. [facility] isn't taking pt until the fall. He will check with Evalee Jefferson. I also will call to give him the name of Donna Bernard, PhD. Otherwise, not sure who to refer him to.

## 2019-07-13 NOTE — Telephone Encounter (Signed)
Have him call his insurance for a referral.

## 2021-10-26 ENCOUNTER — Emergency Department (HOSPITAL_COMMUNITY)
Admission: EM | Admit: 2021-10-26 | Discharge: 2021-10-26 | Disposition: A | Discharge status: Home / Self Care | Payer: 59 | Attending: Emergency Medicine | Admitting: Emergency Medicine

## 2021-10-26 ENCOUNTER — Emergency Department (HOSPITAL_COMMUNITY): Payer: 59

## 2021-10-26 DIAGNOSIS — Y92481 Parking lot as the place of occurrence of the external cause: Secondary | ICD-10-CM | POA: Insufficient documentation

## 2021-10-26 DIAGNOSIS — S2232XA Fracture of one rib, left side, initial encounter for closed fracture: Secondary | ICD-10-CM

## 2021-10-26 DIAGNOSIS — S22060A Wedge compression fracture of T7-T8 vertebra, initial encounter for closed fracture: Secondary | ICD-10-CM | POA: Diagnosis not present

## 2021-10-26 DIAGNOSIS — S20302A Unspecified superficial injuries of left front wall of thorax, initial encounter: Secondary | ICD-10-CM | POA: Diagnosis present

## 2021-10-26 DIAGNOSIS — S82142A Displaced bicondylar fracture of left tibia, initial encounter for closed fracture: Secondary | ICD-10-CM

## 2021-10-26 DIAGNOSIS — X58XXXA Exposure to other specified factors, initial encounter: Secondary | ICD-10-CM | POA: Diagnosis not present

## 2021-10-26 DIAGNOSIS — Z79899 Other long term (current) drug therapy: Secondary | ICD-10-CM | POA: Insufficient documentation

## 2021-10-26 DIAGNOSIS — S8292XA Unspecified fracture of left lower leg, initial encounter for closed fracture: Secondary | ICD-10-CM | POA: Diagnosis not present

## 2021-10-26 LAB — PROTIME-INR
INR: 1 (ref 0.8–1.2)
Prothrombin Time: 13.1 seconds (ref 11.4–15.2)

## 2021-10-26 LAB — CBC
HCT: 42.2 % (ref 39.0–52.0)
Hemoglobin: 14.5 g/dL (ref 13.0–17.0)
MCH: 31.3 pg (ref 26.0–34.0)
MCHC: 34.4 g/dL (ref 30.0–36.0)
MCV: 90.9 fL (ref 80.0–100.0)
Platelets: 292 10*3/uL (ref 150–400)
RBC: 4.64 MIL/uL (ref 4.22–5.81)
RDW: 12 % (ref 11.5–15.5)
WBC: 8.6 10*3/uL (ref 4.0–10.5)
nRBC: 0 % (ref 0.0–0.2)

## 2021-10-26 LAB — COMPREHENSIVE METABOLIC PANEL
ALT: 17 U/L (ref 0–44)
AST: 19 U/L (ref 15–41)
Albumin: 4.1 g/dL (ref 3.5–5.0)
Alkaline Phosphatase: 39 U/L (ref 38–126)
Anion gap: 9 (ref 5–15)
BUN: 11 mg/dL (ref 6–20)
CO2: 24 mmol/L (ref 22–32)
Calcium: 9.1 mg/dL (ref 8.9–10.3)
Chloride: 109 mmol/L (ref 98–111)
Creatinine, Ser: 1.2 mg/dL (ref 0.61–1.24)
GFR, Estimated: >60 mL/min (ref >60)
Glucose, Bld: 116 mg/dL — ABNORMAL HIGH (ref 70–99)
Potassium: 3.8 mmol/L (ref 3.5–5.1)
Sodium: 142 mmol/L (ref 135–145)
Total Bilirubin: 0.6 mg/dL (ref 0.3–1.2)
Total Protein: 6.1 g/dL — ABNORMAL LOW (ref 6.5–8.1)

## 2021-10-26 LAB — I-STAT CHEM 8, ED
BUN: 12 mg/dL (ref 6–20)
Calcium, Ion: 1.1 mmol/L — ABNORMAL LOW (ref 1.15–1.40)
Chloride: 106 mmol/L (ref 98–111)
Creatinine, Ser: 1.1 mg/dL (ref 0.61–1.24)
Glucose, Bld: 112 mg/dL — ABNORMAL HIGH (ref 70–99)
HCT: 41 % (ref 39.0–52.0)
Hemoglobin: 13.9 g/dL (ref 13.0–17.0)
Potassium: 3.7 mmol/L (ref 3.5–5.1)
Sodium: 142 mmol/L (ref 135–145)
TCO2: 24 mmol/L (ref 22–32)

## 2021-10-26 LAB — SAMPLE TO BLOOD BANK

## 2021-10-26 LAB — LACTIC ACID, PLASMA: Lactic Acid, Venous: 1.8 mmol/L (ref 0.5–1.9)

## 2021-10-26 LAB — ETHANOL: Alcohol, Ethyl (B): <10 mg/dL (ref <10)

## 2021-10-26 MED ORDER — HYDROMORPHONE HCL 1 MG/ML IJ SOLN
1.0000 mg | Freq: Once | INTRAMUSCULAR | Status: AC
  Administered 2021-10-26: 1 mg via INTRAVENOUS
  Filled 2021-10-26: qty 1

## 2021-10-26 MED ORDER — OXYCODONE-ACETAMINOPHEN 5-325 MG PO TABS: 1.0000 | ORAL_TABLET | Freq: Four times a day (QID) | ORAL | 0 refills | Status: DC | PRN

## 2021-10-26 MED ORDER — NAPROXEN 375 MG PO TABS: 375.0000 mg | ORAL_TABLET | Freq: Two times a day (BID) | ORAL | 0 refills | Status: DC

## 2021-10-26 MED ORDER — MORPHINE SULFATE (PF) 4 MG/ML IV SOLN
4.0000 mg | Freq: Once | INTRAVENOUS | Status: AC
  Administered 2021-10-26: 4 mg via INTRAVENOUS
  Filled 2021-10-26: qty 1

## 2021-10-26 MED ORDER — IOHEXOL 300 MG/ML  SOLN
80.0000 mL | Freq: Once | INTRAMUSCULAR | Status: AC | PRN
Start: 2021-10-26 — End: 2021-10-26
  Administered 2021-10-26: 80 mL via INTRAVENOUS

## 2021-10-26 MED ORDER — ONDANSETRON HCL 4 MG/2ML IJ SOLN
4.0000 mg | Freq: Once | INTRAMUSCULAR | Status: AC
  Administered 2021-10-26: 4 mg via INTRAVENOUS
  Filled 2021-10-26: qty 2

## 2021-10-26 NOTE — Progress Notes (Signed)
Orthopedic Tech Progress Note Patient Details:  Theodore Meza 09/20/1990 [id number]  Patient ID: Theodore Meza, male   DOB: 1991-05-01, 31 y.o.   MRN: [id number] I attended trauma page. [name] 10/26/2021, 9:21 PM
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
## 2021-10-26 NOTE — Discharge Instructions (Addendum)
Take the medications as needed for pain.   Follow up with the orthopedic and neurosurgery doctor

## 2021-10-26 NOTE — Progress Notes (Signed)
Orthopedic Tech Progress Note Patient Details:  Theodore Meza 03-Jun-1990 [id number]  Ortho Devices Type of Ortho Device: Crutches, Knee Immobilizer Ortho Device/Splint Location: lle Ortho Device/Splint Interventions: Ordered, Application, Adjustment   Post Interventions Patient Tolerated: Well Instructions Provided: Care of device, Adjustment of device  [name] 10/26/2021, 11:30 PM
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
## 2021-10-26 NOTE — ED Notes (Signed)
Pt to ct 

## 2022-04-29 ENCOUNTER — Other Ambulatory Visit: Payer: Self-pay | Admitting: Neurological Surgery

## 2022-09-01 NOTE — Progress Notes (Signed)
Surgical Instructions    Your procedure is scheduled on Thursday May 2nd.  Report to [facility] Main Entrance "A" at 5:30 A.M., then check in with the Admitting office.  Call this number if you have problems the morning of surgery:  [phone]   If you have any questions prior to your surgery date call [phone]: Open Monday-Friday 8am-4pm If you experience any cold or flu symptoms such as cough, fever, chills, shortness of breath, etc. between now and your scheduled surgery, please notify us at the above number     Remember:  Do not eat after midnight the night before your surgery  You may drink clear liquids until 4:30am the morning of your surgery.   Clear liquids allowed are: Water, Non-Citrus Juices (without pulp), Carbonated Beverages, Clear Tea, Black Coffee ONLY (NO MILK, CREAM OR POWDERED CREAMER of any kind), and Gatorade    Take these medicines the morning of surgery with A SIP OF WATER: loratadine (CLARITIN) 10 MG tablet    As of today, STOP taking any Aspirin (unless otherwise instructed by your surgeon) Aleve, Naproxen, Ibuprofen, Motrin, Advil, Goody's, BC's, all herbal medications, fish oil, and all vitamins.           Do not wear jewelry  Do not wear lotions, powders, cologne or deodorant. Do not shave 48 hours prior to surgery.  Men may shave face and neck. Do not bring valuables to the hospital. Do not wear nail polish  [facility] is not responsible for any belongings or valuables.    Do NOT Smoke (Tobacco/Vaping)  24 hours prior to your procedure  If you use a CPAP at night, you may bring your mask for your overnight stay.   Contacts, glasses, hearing aids, dentures or partials may not be worn into surgery, please bring cases for these belongings   For patients admitted to the hospital, discharge time will be determined by your treatment team.   Patients discharged the day of surgery will not be allowed to drive home, and someone needs to stay with  them for 24 hours.   SURGICAL WAITING ROOM VISITATION Patients having surgery or a procedure may have no more than 2 support people in the waiting area - these visitors may rotate.   Children under the age of 60 must have an adult with them who is not the patient. If the patient needs to stay at the hospital during part of their recovery, the visitor guidelines for inpatient rooms apply. Pre-op nurse will coordinate an appropriate time for 1 support person to accompany patient in pre-op.  This support person may not rotate.   Please refer to [email or web address] for the visitor guidelines for Inpatients (after your surgery is over and you are in a regular room).    Special instructions:    Oral Hygiene is also important to reduce your risk of infection.  Remember - BRUSH YOUR TEETH THE MORNING OF SURGERY WITH YOUR REGULAR TOOTHPASTE   [facility]- Preparing For Surgery  Before surgery, you can play an important role. Because skin is not sterile, your skin needs to be as free of germs as possible. You can reduce the number of germs on your skin by washing with CHG (chlorahexidine gluconate) Soap before surgery.  CHG is an antiseptic cleaner which kills germs and bonds with the skin to continue killing germs even after washing.     Please do not use if you have an allergy to CHG or antibacterial soaps. If your skin becomes  reddened/irritated stop using the CHG.  Do not shave (including legs and underarms) for at least 48 hours prior to first CHG shower. It is OK to shave your face.    Please follow your separarte handout on CHG bath instructions carefully.    Day of Surgery:  Take a shower with CHG soap. Wear Clean/Comfortable clothing the morning of surgery Do not apply any deodorants/lotions.   Remember to brush your teeth WITH YOUR REGULAR TOOTHPASTE.    If you received a COVID test during your pre-op visit, it is requested that  you wear a mask when out in public, stay away from anyone that may not be feeling well, and notify your surgeon if you develop symptoms. If you have been in contact with anyone that has tested positive in the last 10 days, please notify your surgeon.    Please read over the following fact sheets that you were given.

## 2022-09-02 ENCOUNTER — Other Ambulatory Visit: Payer: Self-pay

## 2022-09-02 ENCOUNTER — Encounter (HOSPITAL_COMMUNITY)
Admission: RE | Admit: 2022-09-02 | Discharge: 2022-09-02 | Disposition: A | Discharge status: Home / Self Care | Payer: BC Managed Care – PPO | Source: Ambulatory Visit | Attending: Neurological Surgery | Admitting: Neurological Surgery

## 2022-09-02 ENCOUNTER — Encounter (HOSPITAL_COMMUNITY): Payer: Self-pay

## 2022-09-02 VITALS — BP 116/79 | HR 64 | Temp 98.3°F | Resp 18 | Ht 75.0 in | Wt 198.8 lb

## 2022-09-02 DIAGNOSIS — Z01818 Encounter for other preprocedural examination: Secondary | ICD-10-CM

## 2022-09-02 DIAGNOSIS — Z01812 Encounter for preprocedural laboratory examination: Secondary | ICD-10-CM | POA: Insufficient documentation

## 2022-09-02 HISTORY — DX: Irritable bowel syndrome without diarrhea: K58.9

## 2022-09-02 HISTORY — DX: Asperger's syndrome: F84.5

## 2022-09-02 HISTORY — DX: Anxiety disorder, unspecified: F41.9

## 2022-09-02 HISTORY — DX: Other specified postprocedural states: Z98.890

## 2022-09-02 HISTORY — DX: Other complications of anesthesia, initial encounter: T88.59XA

## 2022-09-02 LAB — CBC
HCT: 44.1 % (ref 39.0–52.0)
Hemoglobin: 14.8 g/dL (ref 13.0–17.0)
MCH: 30.8 pg (ref 26.0–34.0)
MCHC: 33.6 g/dL (ref 30.0–36.0)
MCV: 91.9 fL (ref 80.0–100.0)
Platelets: 235 10*3/uL (ref 150–400)
RBC: 4.8 MIL/uL (ref 4.22–5.81)
RDW: 12.2 % (ref 11.5–15.5)
WBC: 4.7 10*3/uL (ref 4.0–10.5)
nRBC: 0 % (ref 0.0–0.2)

## 2022-09-02 LAB — SURGICAL PCR SCREEN
MRSA, PCR: NEGATIVE
Staphylococcus aureus: NEGATIVE

## 2022-09-02 LAB — TYPE AND SCREEN
ABO/RH(D): O POS
Antibody Screen: NEGATIVE

## 2022-09-02 NOTE — Progress Notes (Signed)
PCP - Dr. Jenita Seashore with [facility] has since retired Development worker, international aid - Denies  PPM/ICD - Denies  Chest x-ray - N/I EKG - n/i Stress Test - Denies ECHO - Denies Cardiac Cath - Denies  Sleep Study - Denies  DM - Denies  Blood Thinner Instructions: N/A Aspirin Instructions:N/A  ERAS Protcol -yes  COVID TEST- n/i  Patient denies any respiratory virus or illness in the last two months.    Anesthesia review: No  Patient denies shortness of breath, fever, cough and chest pain at PAT appointment   All instructions explained to the patient, with a verbal understanding of the material. Patient agrees to go over the instructions while at home for a better understanding.  The opportunity to ask questions was provided.

## 2022-09-10 NOTE — Anesthesia Preprocedure Evaluation (Signed)
Anesthesia Evaluation  Patient identified by MRN, date of birth, ID band Patient awake    Reviewed: Allergy & Precautions, NPO status , Patient's Chart, lab work & pertinent test results  History of Anesthesia Complications (+) PONV and history of anesthetic complications  Airway Mallampati: II  TM Distance: >3 FB Neck ROM: Full    Dental no notable dental hx. (+) Dental Advisory Given, Teeth Intact   Pulmonary former smoker   Pulmonary exam normal breath sounds clear to auscultation       Cardiovascular negative cardio ROS Normal cardiovascular exam Rhythm:Regular Rate:Normal     Neuro/Psych  PSYCHIATRIC DISORDERS Anxiety     negative neurological ROS     GI/Hepatic negative GI ROS, Neg liver ROS,,,  Endo/Other  negative endocrine ROS    Renal/GU negative Renal ROS     Musculoskeletal negative musculoskeletal ROS (+)    Abdominal   Peds  Hematology negative hematology ROS (+)   Anesthesia Other Findings   Reproductive/Obstetrics                             Anesthesia Physical Anesthesia Plan  ASA: 2  Anesthesia Plan: General   Post-op Pain Management: Tylenol PO (pre-op)* and Gabapentin PO (pre-op)*   Induction: Intravenous  PONV Risk Score and Plan: 4 or greater and Ondansetron, Dexamethasone, Treatment may vary due to age or medical condition and Midazolam  Airway Management Planned: Oral ETT  Additional Equipment:   Intra-op Plan:   Post-operative Plan: Extubation in OR  Informed Consent: I have reviewed the patients History and Physical, chart, labs and discussed the procedure including the risks, benefits and alternatives for the proposed anesthesia with the patient or authorized representative who has indicated his/her understanding and acceptance.     Dental advisory given  Plan Discussed with: CRNA  Anesthesia Plan Comments:         Anesthesia Quick  Evaluation

## 2022-09-11 ENCOUNTER — Ambulatory Visit (HOSPITAL_COMMUNITY): Payer: BC Managed Care – PPO | Admitting: Anesthesiology

## 2022-09-11 ENCOUNTER — Other Ambulatory Visit: Payer: Self-pay

## 2022-09-11 ENCOUNTER — Encounter (HOSPITAL_COMMUNITY)
Admission: RE | Disposition: A | Discharge status: Home / Self Care | Payer: Self-pay | Source: Home / Self Care | Attending: Neurological Surgery

## 2022-09-11 ENCOUNTER — Observation Stay (HOSPITAL_COMMUNITY)
Admission: RE | Admit: 2022-09-11 | Discharge: 2022-09-12 | Disposition: A | Discharge status: Home / Self Care | Payer: BC Managed Care – PPO | Attending: Neurological Surgery | Admitting: Neurological Surgery

## 2022-09-11 ENCOUNTER — Ambulatory Visit (HOSPITAL_COMMUNITY): Payer: BC Managed Care – PPO

## 2022-09-11 ENCOUNTER — Encounter (HOSPITAL_COMMUNITY): Payer: Self-pay | Admitting: Neurological Surgery

## 2022-09-11 DIAGNOSIS — M4317 Spondylolisthesis, lumbosacral region: Principal | ICD-10-CM | POA: Insufficient documentation

## 2022-09-11 DIAGNOSIS — M4316 Spondylolisthesis, lumbar region: Secondary | ICD-10-CM

## 2022-09-11 HISTORY — PX: TRANSFORAMINAL LUMBAR INTERBODY FUSION W/ MIS 1 LEVEL: SHX6145

## 2022-09-11 LAB — ABO/RH: ABO/RH(D): O POS

## 2022-09-11 SURGERY — MINIMALLY INVASIVE (MIS) TRANSFORAMINAL LUMBAR INTERBODY FUSION (TLIF) 1 LEVEL
Anesthesia: General | Site: Spine Lumbar

## 2022-09-11 MED ORDER — HYDROMORPHONE HCL 1 MG/ML IJ SOLN
INTRAMUSCULAR | Status: AC
  Filled 2022-09-11: qty 0.5

## 2022-09-11 MED ORDER — CHLORHEXIDINE GLUCONATE CLOTH 2 % EX PADS: 6.0000 | MEDICATED_PAD | Freq: Once | CUTANEOUS | Status: DC

## 2022-09-11 MED ORDER — CEFAZOLIN SODIUM-DEXTROSE 2-4 GM/100ML-% IV SOLN
2.0000 g | Freq: Three times a day (TID) | INTRAVENOUS | Status: AC
  Administered 2022-09-11 – 2022-09-12 (×2): 2 g via INTRAVENOUS
  Filled 2022-09-11 (×2): qty 100

## 2022-09-11 MED ORDER — PROPOFOL 10 MG/ML IV BOLUS
INTRAVENOUS | Status: DC | PRN
  Administered 2022-09-11: 200 mg via INTRAVENOUS
  Administered 2022-09-11 (×3): 20 mg via INTRAVENOUS

## 2022-09-11 MED ORDER — ACETAMINOPHEN 500 MG PO TABS
1000.0000 mg | ORAL_TABLET | Freq: Once | ORAL | Status: AC
  Administered 2022-09-11: 1000 mg via ORAL
  Filled 2022-09-11: qty 2

## 2022-09-11 MED ORDER — ACETAMINOPHEN 325 MG PO TABS
650.0000 mg | ORAL_TABLET | ORAL | Status: DC | PRN
  Administered 2022-09-11 – 2022-09-12 (×4): 650 mg via ORAL
  Filled 2022-09-11 (×4): qty 2

## 2022-09-11 MED ORDER — HYDROMORPHONE HCL 1 MG/ML IJ SOLN
INTRAMUSCULAR | Status: AC
  Filled 2022-09-11: qty 1

## 2022-09-11 MED ORDER — PROPOFOL 10 MG/ML IV BOLUS
INTRAVENOUS | Status: AC
  Filled 2022-09-11: qty 20

## 2022-09-11 MED ORDER — FENTANYL CITRATE (PF) 250 MCG/5ML IJ SOLN
INTRAMUSCULAR | Status: DC | PRN
  Administered 2022-09-11: 25 ug via INTRAVENOUS
  Administered 2022-09-11: 50 ug via INTRAVENOUS
  Administered 2022-09-11: 25 ug via INTRAVENOUS
  Administered 2022-09-11 (×4): 50 ug via INTRAVENOUS

## 2022-09-11 MED ORDER — PHENOL 1.4 % MT LIQD: 1.0000 | OROMUCOSAL | Status: DC | PRN

## 2022-09-11 MED ORDER — THROMBIN 5000 UNITS EX SOLR
CUTANEOUS | Status: AC
  Filled 2022-09-11: qty 5000

## 2022-09-11 MED ORDER — SODIUM CHLORIDE 0.9% FLUSH
3.0000 mL | Freq: Two times a day (BID) | INTRAVENOUS | Status: DC
  Administered 2022-09-11: 3 mL via INTRAVENOUS

## 2022-09-11 MED ORDER — PHENYLEPHRINE HCL-NACL 20-0.9 MG/250ML-% IV SOLN
INTRAVENOUS | Status: DC | PRN
  Administered 2022-09-11: 30 ug/min via INTRAVENOUS

## 2022-09-11 MED ORDER — LORATADINE 10 MG PO TABS
10.0000 mg | ORAL_TABLET | Freq: Every day | ORAL | Status: DC
  Filled 2022-09-11 (×2): qty 1

## 2022-09-11 MED ORDER — ROCURONIUM BROMIDE 10 MG/ML (PF) SYRINGE
PREFILLED_SYRINGE | INTRAVENOUS | Status: AC
  Filled 2022-09-11: qty 10

## 2022-09-11 MED ORDER — MIDAZOLAM HCL 2 MG/2ML IJ SOLN
INTRAMUSCULAR | Status: DC | PRN
  Administered 2022-09-11: 2 mg via INTRAVENOUS

## 2022-09-11 MED ORDER — MENTHOL 3 MG MT LOZG: 1.0000 | LOZENGE | OROMUCOSAL | Status: DC | PRN

## 2022-09-11 MED ORDER — LACTATED RINGERS IV SOLN: INTRAVENOUS | Status: DC

## 2022-09-11 MED ORDER — PROMETHAZINE HCL 25 MG/ML IJ SOLN: 6.2500 mg | INTRAMUSCULAR | Status: DC | PRN

## 2022-09-11 MED ORDER — LAMOTRIGINE 25 MG PO TABS
25.0000 mg | ORAL_TABLET | Freq: Every evening | ORAL | Status: DC
  Administered 2022-09-11: 25 mg via ORAL
  Filled 2022-09-11 (×2): qty 1

## 2022-09-11 MED ORDER — KETOROLAC TROMETHAMINE 15 MG/ML IJ SOLN
INTRAMUSCULAR | Status: DC | PRN
  Administered 2022-09-11: 15 mg via INTRAVENOUS

## 2022-09-11 MED ORDER — HYDROMORPHONE HCL 1 MG/ML IJ SOLN
1.0000 mg | INTRAMUSCULAR | Status: DC | PRN
  Administered 2022-09-11: 1 mg via INTRAVENOUS
  Filled 2022-09-11: qty 1

## 2022-09-11 MED ORDER — POLYETHYLENE GLYCOL 3350 17 G PO PACK: 17.0000 g | PACK | Freq: Every day | ORAL | Status: DC | PRN

## 2022-09-11 MED ORDER — FENTANYL CITRATE (PF) 250 MCG/5ML IJ SOLN
INTRAMUSCULAR | Status: AC
  Filled 2022-09-11: qty 5

## 2022-09-11 MED ORDER — CHLORHEXIDINE GLUCONATE 0.12 % MT SOLN
15.0000 mL | Freq: Once | OROMUCOSAL | Status: AC
  Administered 2022-09-11: 15 mL via OROMUCOSAL
  Filled 2022-09-11: qty 15

## 2022-09-11 MED ORDER — MIDAZOLAM HCL 2 MG/2ML IJ SOLN
INTRAMUSCULAR | Status: AC
  Filled 2022-09-11: qty 2

## 2022-09-11 MED ORDER — DOCUSATE SODIUM 100 MG PO CAPS
100.0000 mg | ORAL_CAPSULE | Freq: Two times a day (BID) | ORAL | Status: DC
  Administered 2022-09-11 – 2022-09-12 (×3): 100 mg via ORAL
  Filled 2022-09-11 (×3): qty 1

## 2022-09-11 MED ORDER — AMISULPRIDE (ANTIEMETIC) 5 MG/2ML IV SOLN: 10.0000 mg | Freq: Once | INTRAVENOUS | Status: DC | PRN

## 2022-09-11 MED ORDER — SCOPOLAMINE 1 MG/3DAYS TD PT72
MEDICATED_PATCH | TRANSDERMAL | Status: DC | PRN
  Administered 2022-09-11: 1 via TRANSDERMAL

## 2022-09-11 MED ORDER — SUGAMMADEX SODIUM 200 MG/2ML IV SOLN
INTRAVENOUS | Status: DC | PRN
  Administered 2022-09-11: 200 mg via INTRAVENOUS

## 2022-09-11 MED ORDER — LIDOCAINE 2% (20 MG/ML) 5 ML SYRINGE
INTRAMUSCULAR | Status: DC | PRN
  Administered 2022-09-11: 100 mg via INTRAVENOUS
  Administered 2022-09-11: 40 mg via INTRAVENOUS

## 2022-09-11 MED ORDER — DEXAMETHASONE SODIUM PHOSPHATE 10 MG/ML IJ SOLN
INTRAMUSCULAR | Status: DC | PRN
  Administered 2022-09-11: 10 mg via INTRAVENOUS

## 2022-09-11 MED ORDER — LIDOCAINE 2% (20 MG/ML) 5 ML SYRINGE
INTRAMUSCULAR | Status: AC
  Filled 2022-09-11: qty 5

## 2022-09-11 MED ORDER — GABAPENTIN 300 MG PO CAPS
300.0000 mg | ORAL_CAPSULE | Freq: Once | ORAL | Status: AC
  Administered 2022-09-11: 300 mg via ORAL
  Filled 2022-09-11: qty 1

## 2022-09-11 MED ORDER — ONDANSETRON HCL 4 MG/2ML IJ SOLN
INTRAMUSCULAR | Status: DC | PRN
  Administered 2022-09-11: 4 mg via INTRAVENOUS

## 2022-09-11 MED ORDER — 0.9 % SODIUM CHLORIDE (POUR BTL) OPTIME
TOPICAL | Status: DC | PRN
  Administered 2022-09-11: 1000 mL

## 2022-09-11 MED ORDER — HYDROMORPHONE HCL 1 MG/ML IJ SOLN
INTRAMUSCULAR | Status: DC | PRN
  Administered 2022-09-11: .5 mg via INTRAVENOUS

## 2022-09-11 MED ORDER — SODIUM CHLORIDE 0.9 % IV SOLN: 250.0000 mL | INTRAVENOUS | Status: DC

## 2022-09-11 MED ORDER — ONDANSETRON HCL 4 MG/2ML IJ SOLN: 4.0000 mg | Freq: Four times a day (QID) | INTRAMUSCULAR | Status: DC | PRN

## 2022-09-11 MED ORDER — LIDOCAINE-EPINEPHRINE 1 %-1:100000 IJ SOLN
INTRAMUSCULAR | Status: AC
  Filled 2022-09-11: qty 1

## 2022-09-11 MED ORDER — MEPERIDINE HCL 25 MG/ML IJ SOLN: 6.2500 mg | INTRAMUSCULAR | Status: DC | PRN

## 2022-09-11 MED ORDER — ROCURONIUM BROMIDE 10 MG/ML (PF) SYRINGE
PREFILLED_SYRINGE | INTRAVENOUS | Status: DC | PRN
  Administered 2022-09-11 (×2): 30 mg via INTRAVENOUS
  Administered 2022-09-11: 70 mg via INTRAVENOUS

## 2022-09-11 MED ORDER — SODIUM CHLORIDE 0.9% FLUSH: 3.0000 mL | INTRAVENOUS | Status: DC | PRN

## 2022-09-11 MED ORDER — ONDANSETRON HCL 4 MG PO TABS: 4.0000 mg | ORAL_TABLET | Freq: Four times a day (QID) | ORAL | Status: DC | PRN

## 2022-09-11 MED ORDER — CYCLOBENZAPRINE HCL 10 MG PO TABS
10.0000 mg | ORAL_TABLET | Freq: Three times a day (TID) | ORAL | Status: DC | PRN
  Administered 2022-09-11 – 2022-09-12 (×3): 10 mg via ORAL
  Filled 2022-09-11 (×3): qty 1

## 2022-09-11 MED ORDER — ACETAMINOPHEN 650 MG RE SUPP: 650.0000 mg | RECTAL | Status: DC | PRN

## 2022-09-11 MED ORDER — ORAL CARE MOUTH RINSE: 15.0000 mL | Freq: Once | OROMUCOSAL | Status: AC

## 2022-09-11 MED ORDER — OXYCODONE HCL 5 MG PO TABS
10.0000 mg | ORAL_TABLET | ORAL | Status: DC | PRN
  Administered 2022-09-11 – 2022-09-12 (×6): 10 mg via ORAL
  Filled 2022-09-11 (×6): qty 2

## 2022-09-11 MED ORDER — CEFAZOLIN SODIUM-DEXTROSE 2-4 GM/100ML-% IV SOLN
2.0000 g | INTRAVENOUS | Status: AC
  Administered 2022-09-11: 2 g via INTRAVENOUS
  Filled 2022-09-11: qty 100

## 2022-09-11 MED ORDER — PHENYLEPHRINE 80 MCG/ML (10ML) SYRINGE FOR IV PUSH (FOR BLOOD PRESSURE SUPPORT)
PREFILLED_SYRINGE | INTRAVENOUS | Status: DC | PRN
  Administered 2022-09-11: 80 ug via INTRAVENOUS
  Administered 2022-09-11 (×4): 40 ug via INTRAVENOUS
  Administered 2022-09-11: 80 ug via INTRAVENOUS
  Administered 2022-09-11: 40 ug via INTRAVENOUS
  Administered 2022-09-11: 80 ug via INTRAVENOUS
  Administered 2022-09-11 (×2): 40 ug via INTRAVENOUS
  Administered 2022-09-11: 80 ug via INTRAVENOUS

## 2022-09-11 MED ORDER — THROMBIN 5000 UNITS EX SOLR
OROMUCOSAL | Status: DC | PRN
  Administered 2022-09-11: 5 mL via TOPICAL

## 2022-09-11 MED ORDER — LIDOCAINE-EPINEPHRINE 1 %-1:100000 IJ SOLN
INTRAMUSCULAR | Status: DC | PRN
  Administered 2022-09-11: 10 mL

## 2022-09-11 MED ORDER — OXYCODONE HCL 5 MG PO TABS: 5.0000 mg | ORAL_TABLET | ORAL | Status: DC | PRN

## 2022-09-11 MED ORDER — HYDROMORPHONE HCL 1 MG/ML IJ SOLN
0.2500 mg | INTRAMUSCULAR | Status: DC | PRN
  Administered 2022-09-11 (×4): 0.5 mg via INTRAVENOUS

## 2022-09-11 SURGICAL SUPPLY — 74 items
ADH SKN CLS APL DERMABOND .7 (GAUZE/BANDAGES/DRESSINGS) ×2
BAG COUNTER SPONGE SURGICOUNT (BAG) ×2 IMPLANT
BAG SPNG CNTER NS LX DISP (BAG) ×2
BAND INSRT 18 STRL LF DISP RB (MISCELLANEOUS) ×4
BAND RUBBER #18 3X1/16 STRL (MISCELLANEOUS) ×4 IMPLANT
BASKET BONE COLLECTION (BASKET) ×2 IMPLANT
BLADE CLIPPER SURG (BLADE) IMPLANT
BLADE SURG 11 STRL SS (BLADE) ×2 IMPLANT
BUR 14 MATCH 3 (BUR) IMPLANT
BUR MATCHSTICK NEURO 3.0 LAGG (BURR) IMPLANT
BUR MR8 14CM BALL SYMTRI 5 (BUR) IMPLANT
BUR SURG IBUR 4X12.5 (BURR) ×2 IMPLANT
BURR 14 MATCH 3 (BUR) ×2
BURR MR8 14CM BALL SYMTRI 5 (BUR) ×2
BURR SURG IBUR 4X12.5 (BURR)
CAGE INTERBODY PL SHT 7X22.5 (Plate) IMPLANT
CNTNR URN SCR LID CUP LEK RST (MISCELLANEOUS) ×2 IMPLANT
CONT SPEC 4OZ STRL OR WHT (MISCELLANEOUS) ×2
COVER BACK TABLE 60X90IN (DRAPES) ×2 IMPLANT
COVERAGE SUPPORT O-ARM STEALTH (MISCELLANEOUS) ×2 IMPLANT
DERMABOND ADVANCED .7 DNX12 (GAUZE/BANDAGES/DRESSINGS) ×2 IMPLANT
DRAPE C-ARM 42X72 X-RAY (DRAPES) ×2 IMPLANT
DRAPE C-ARMOR (DRAPES) ×2 IMPLANT
DRAPE LAPAROTOMY 100X72X124 (DRAPES) ×2 IMPLANT
DRAPE MICROSCOPE SLANT 54X150 (MISCELLANEOUS) ×2 IMPLANT
DRAPE SHEET LG 3/4 BI-LAMINATE (DRAPES) ×8 IMPLANT
DRAPE SURG 17X23 STRL (DRAPES) ×4 IMPLANT
ELECT BLADE 6.5 EXT (BLADE) ×2 IMPLANT
ELECT REM PT RETURN 9FT ADLT (ELECTROSURGICAL) ×2
ELECTRODE REM PT RTRN 9FT ADLT (ELECTROSURGICAL) ×2 IMPLANT
EXTENDER TAB GUIDE SV 5.5/6.0 (INSTRUMENTS) IMPLANT
FEE COVERAGE SUPPORT O-ARM (MISCELLANEOUS) ×2 IMPLANT
GAUZE 4X4 16PLY ~~LOC~~+RFID DBL (SPONGE) IMPLANT
GAUZE SPONGE 4X4 12PLY STRL (GAUZE/BANDAGES/DRESSINGS) ×2 IMPLANT
GLOVE BIOGEL PI IND STRL 7.5 (GLOVE) ×2 IMPLANT
GLOVE ECLIPSE 7.5 STRL STRAW (GLOVE) ×2 IMPLANT
GOWN STRL REUS W/ TWL LRG LVL3 (GOWN DISPOSABLE) ×2 IMPLANT
GOWN STRL REUS W/ TWL XL LVL3 (GOWN DISPOSABLE) IMPLANT
GOWN STRL REUS W/TWL 2XL LVL3 (GOWN DISPOSABLE) IMPLANT
GOWN STRL REUS W/TWL LRG LVL3 (GOWN DISPOSABLE) ×2
GOWN STRL REUS W/TWL XL LVL3 (GOWN DISPOSABLE)
HEMOSTAT POWDER KIT SURGIFOAM (HEMOSTASIS) ×2 IMPLANT
KIT BASIN OR (CUSTOM PROCEDURE TRAY) ×2 IMPLANT
KIT INFUSE X SMALL 1.4CC (Orthopedic Implant) IMPLANT
KIT POSITION SURG JACKSON T1 (MISCELLANEOUS) ×2 IMPLANT
KIT TURNOVER KIT B (KITS) IMPLANT
MARKER SPHERE PSV REFLC NDI (MISCELLANEOUS) ×10 IMPLANT
NDL HYPO 18GX1.5 BLUNT FILL (NEEDLE) IMPLANT
NDL SPNL 18GX3.5 QUINCKE PK (NEEDLE) IMPLANT
NEEDLE HYPO 18GX1.5 BLUNT FILL (NEEDLE) IMPLANT
NEEDLE HYPO 22GX1.5 SAFETY (NEEDLE) ×2 IMPLANT
NEEDLE SPNL 18GX3.5 QUINCKE PK (NEEDLE) IMPLANT
NS IRRIG 1000ML POUR BTL (IV SOLUTION) ×2 IMPLANT
PACK LAMINECTOMY NEURO (CUSTOM PROCEDURE TRAY) ×2 IMPLANT
PAD ARMBOARD 7.5X6 YLW CONV (MISCELLANEOUS) ×4 IMPLANT
PIN BONE FIX 100 (PIN) IMPLANT
ROD 5.5 CCM PERC 40 (Rod) IMPLANT
SCREW MAS VG 7.5X35 (Screw) IMPLANT
SCREW SET 5.5/6.0MM SOLERA (Screw) IMPLANT
SCREW SPINAL IFIX 6.5X45 (Screw) IMPLANT
SOL ELECTROSURG ANTI STICK (MISCELLANEOUS) ×4
SOLUTION ELECTROSURG ANTI STCK (MISCELLANEOUS) ×4 IMPLANT
SPIKE FLUID TRANSFER (MISCELLANEOUS) ×2 IMPLANT
SPONGE T-LAP 4X18 ~~LOC~~+RFID (SPONGE) IMPLANT
SUT MNCRL AB 3-0 PS2 18 (SUTURE) ×2 IMPLANT
SUT VIC AB 0 CT1 18XCR BRD8 (SUTURE) IMPLANT
SUT VIC AB 0 CT1 8-18 (SUTURE)
SUT VIC AB 2-0 CP2 18 (SUTURE) ×2 IMPLANT
SYR 3ML LL SCALE MARK (SYRINGE) IMPLANT
TOWEL GREEN STERILE (TOWEL DISPOSABLE) ×2 IMPLANT
TOWEL GREEN STERILE FF (TOWEL DISPOSABLE) ×2 IMPLANT
TRAY FOLEY MTR SLVR 14FR STAT (SET/KITS/TRAYS/PACK) IMPLANT
TRAY FOLEY MTR SLVR 16FR STAT (SET/KITS/TRAYS/PACK) IMPLANT
WATER STERILE IRR 1000ML POUR (IV SOLUTION) ×2 IMPLANT
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
## 2022-09-11 NOTE — Anesthesia Postprocedure Evaluation (Signed)
Anesthesia Post Note  Patient: Theodore Meza  Procedure(s) Performed: Lumbar five-Sacral one  Minimally Invasive Transforaminal Lumbar Interbody Fusion (Spine Lumbar) Application of O-Arm     Patient location during evaluation: PACU Anesthesia Type: General Level of consciousness: sedated and patient cooperative Pain management: pain level controlled Vital Signs Assessment: post-procedure vital signs reviewed and stable Respiratory status: spontaneous breathing Cardiovascular status: stable Anesthetic complications: no   No notable events documented.  Last Vitals:  Vitals:   09/11/22 1215 09/11/22 1243  BP: 114/80 124/88  Pulse: 66 98  Resp: 16 20  Temp: 36.7 C   SpO2: 99% 100%    Last Pain:  Vitals:   09/11/22 1325  TempSrc:   PainSc: 8                  Lewie Loron

## 2022-09-11 NOTE — Anesthesia Procedure Notes (Signed)
Procedure Name: Intubation Date/Time: 09/11/2022 7:42 AM  Performed by: Stanton Kidney, CRNAPre-anesthesia Checklist: Patient identified Patient Re-evaluated:Patient Re-evaluated prior to induction Oxygen Delivery Method: Circle system utilized Preoxygenation: Pre-oxygenation with 100% oxygen Induction Type: IV induction Ventilation: Mask ventilation without difficulty Tube type: Oral Tube size: 7.5 mm Number of attempts: 1 Airway Equipment and Method: Stylet and Oral airway Placement Confirmation: ETT inserted through vocal cords under direct vision, positive ETCO2 and breath sounds checked- equal and bilateral Secured at: 22 cm Tube secured with: Tape Dental Injury: Teeth and Oropharynx as per pre-operative assessment

## 2022-09-11 NOTE — Transfer of Care (Signed)
Immediate Anesthesia Transfer of Care Note  Patient: Theodore Meza  Procedure(s) Performed: Lumbar five-Sacral one  Minimally Invasive Transforaminal Lumbar Interbody Fusion (Spine Lumbar) Application of O-Arm  Patient Location: PACU  Anesthesia Type:General  Level of Consciousness: drowsy and patient cooperative  Airway & Oxygen Therapy: Patient Spontanous Breathing and Patient connected to face mask oxygen  Post-op Assessment: Report given to RN and Post -op Vital signs reviewed and stable  Post vital signs: Reviewed and stable  Last Vitals:  Vitals Value Taken Time  BP 111/70 09/11/22 1105  Temp 36.7 C 09/11/22 1105  Pulse 67 09/11/22 1110  Resp 12 09/11/22 1110  SpO2 100 % 09/11/22 1110  Vitals shown include unvalidated device data.  Last Pain:  Vitals:   09/11/22 0603  TempSrc:   PainSc: 4       Patients Stated Pain Goal: 0 (09/11/22 0603)  Complications: No notable events documented.

## 2022-09-11 NOTE — H&P (Signed)
Surgical H&P Update  HPI: 32 y.o. with a history of low back pain with a grade 2 spondylolisthesis.  No changes in health since they were last seen. Still having back pain today and wishes to proceed with surgery.  PMHx:  Past Medical History:  Diagnosis Date   Anxiety    Appendicitis 2007   Asperger syndrome    Complication of anesthesia    Concussion    Crohn disease (HCC)    pt not sure if ever officially diagnosed with chrons   IBS (irritable bowel syndrome)    PONV (postoperative nausea and vomiting)    FamHx: History reviewed. No pertinent family history. SocHx:  reports that he quit smoking about 4 years ago. His smoking use included cigarettes. He has a 1.00 pack-year smoking history. He has never used smokeless tobacco. He reports that he does not currently use alcohol. He reports that he does not currently use drugs.  Physical Exam: Strength 5/5 x4 and SILTx4   Assesment/Plan: 32 y.o. man with low back pain and grade 2 5-1 spondy, here for MIS TLIF. Risks, benefits, and alternatives discussed and the patient would like to continue with surgery.  -OR today -3C post-op  Jadene Pierini, MD 09/11/22 7:20 AM

## 2022-09-12 ENCOUNTER — Encounter (HOSPITAL_COMMUNITY): Payer: Self-pay | Admitting: Neurological Surgery

## 2022-09-12 DIAGNOSIS — M4317 Spondylolisthesis, lumbosacral region: Secondary | ICD-10-CM | POA: Diagnosis not present

## 2022-09-12 MED ORDER — DIPHENHYDRAMINE HCL 25 MG PO CAPS
25.0000 mg | ORAL_CAPSULE | Freq: Four times a day (QID) | ORAL | Status: DC | PRN
  Administered 2022-09-12: 25 mg via ORAL
  Filled 2022-09-12: qty 1

## 2022-09-12 MED ORDER — CYCLOBENZAPRINE HCL 10 MG PO TABS: 10.0000 mg | ORAL_TABLET | Freq: Three times a day (TID) | ORAL | 2 refills | Status: AC | PRN

## 2022-09-12 MED ORDER — DOCUSATE SODIUM 100 MG PO CAPS: 100.0000 mg | ORAL_CAPSULE | Freq: Two times a day (BID) | ORAL | 2 refills | Status: AC

## 2022-09-12 MED ORDER — OXYCODONE-ACETAMINOPHEN 5-325 MG PO TABS: 1.0000 | ORAL_TABLET | Freq: Four times a day (QID) | ORAL | 0 refills | Status: AC | PRN

## 2022-09-12 NOTE — Progress Notes (Signed)
PT Cancellation Note  Patient Details Name: Theodore Meza MRN: [id number] DOB: 1990/09/07   Cancelled Treatment:    Reason Eval/Treat Not Completed: PT screened, no needs identified, will sign off. Discussed pt case with OT who reports pt is currently mobilizing without assistance and completed stair training. Pt does not require a formal PT evaluation at this time. PT signing off. If needs change, please reconsult.     Marylynn Pearson 09/12/2022, 10:49 AM  Conni Slipper, PT, DPT Acute Rehabilitation Services Secure Chat Preferred Office: [phone]

## 2022-09-12 NOTE — Evaluation (Signed)
Occupational Therapy Evaluation Patient Details Name: Theodore Meza MRN: [id number] DOB: [birth year] Today's Date: 09/12/2022   History of Present Illness 32 yo s/p TLIF L5-S1 PMH anxiety, Asperger syndrome, crohn disease, IBS   Clinical Impression   Patient evaluated by Occupational Therapy with no further acute OT needs identified. All education has been completed and the patient has no further questions. See below for any follow-up Occupational Therapy or equipment needs. OT to sign off. Thank you for referral.        Recommendations for follow up therapy are one component of a multi-disciplinary discharge planning process, led by the attending physician.  Recommendations may be updated based on patient status, additional functional criteria and insurance authorization.   Assistance Recommended at Discharge None  Patient can return home with the following Assist for transportation (initially to get home but should progres to indep driving soon)    Functional Status Assessment  Patient has had a recent decline in their functional status and demonstrates the ability to make significant improvements in function in a reasonable and predictable amount of time.  Equipment Recommendations  None recommended by OT    Recommendations for Other Services       Precautions / Restrictions Precautions Precautions: Back Precaution Comments: back handout provided and reviewed in detail for adls      Mobility Bed Mobility Overal bed mobility: Independent                  Transfers Overall transfer level: Independent                        Balance                                           ADL either performed or assessed with clinical judgement   ADL Overall ADL's : Modified independent                                       General ADL Comments: able to figure 4 cross. completed stairs and bed mobility. Educated on adls with back  precautions with good return demo   Back handout provided and reviewed adls in detail. Pt educated on:  set an alarm at night for medication, avoid sitting for long periods of time, correct bed positioning for sleeping, correct sequence for bed mobility, avoiding lifting more than 5 pounds and never wash directly over incision. Make sure that animals come to the patient and he doesn't lift them. All education is complete and patient indicates understanding.   Vision Baseline Vision/History: 1 Wears glasses Ability to See in Adequate Light: 0 Adequate Patient Visual Report: No change from baseline       Perception     Praxis      Pertinent Vitals/Pain Pain Assessment Pain Assessment: No/denies pain     Hand Dominance Right   Extremity/Trunk Assessment Upper Extremity Assessment Upper Extremity Assessment: Overall WFL for tasks assessed   Lower Extremity Assessment Lower Extremity Assessment: Overall WFL for tasks assessed   Cervical / Trunk Assessment Cervical / Trunk Assessment: Back Surgery   Communication Communication Communication: No difficulties   Cognition Arousal/Alertness: Awake/alert Behavior During Therapy: WFL for tasks assessed/performed Overall Cognitive Status: Within Functional Limits for tasks assessed  General Comments  open to air wounds. noted to have some drainage on sheets in the bed but none active at this time.    Exercises     Shoulder Instructions      Home Living Family/patient expects to be discharged to:: Private residence Living Arrangements: Spouse/significant other (girlfriend Fleet Contras) Available Help at Discharge: Family;Available PRN/intermittently Type of Home: House Home Access: Stairs to enter [employer] of Steps: 3 Entrance Stairs-Rails: Left Home Layout: One level     Bathroom Shower/Tub: Chief Strategy Officer: Standard     Home Equipment:  Mudlogger: Reacher Additional Comments: 6 dogs 2 cats in the home, Fleet Contras can care for the pets.      Prior Functioning/Environment Prior Level of Function : Independent/Modified Independent;Driving               ADLs Comments: works as IT consultant spoke about environmental modifications to help with lifting restrictions. pt has lap top for computer access        OT Problem List:        OT Treatment/Interventions:      OT Goals(Current goals can be found in the care plan section) Acute Rehab OT Goals Patient Stated Goal: return to work by June  OT Frequency:      Co-evaluation              AM-PAC OT "6 Clicks" Daily Activity     Outcome Measure Help from another person eating meals?: None Help from another person taking care of personal grooming?: None Help from another person toileting, which includes using toliet, bedpan, or urinal?: None Help from another person bathing (including washing, rinsing, drying)?: None Help from another person to put on and taking off regular upper body clothing?: None Help from another person to put on and taking off regular lower body clothing?: None 6 Click Score: 24   End of Session Nurse Communication: Mobility status;Precautions  Activity Tolerance:   Patient left: in chair;with call bell/phone within reach;with family/visitor present  OT Visit Diagnosis: Unsteadiness on feet (R26.81)                Time: 1610-9604 OT Time Calculation (min): 21 min Charges:  OT General Charges $OT Visit: 1 Visit OT Evaluation $OT Eval Moderate Complexity: 1 Mod   Brynn, OTR/L  Acute Rehabilitation Services Office: [phone] .   Mateo Flow 09/12/2022, 11:03 AM

## 2022-09-12 NOTE — Discharge Instructions (Signed)
Wound Care Leave incision open to air. You may shower. Do not scrub directly on incision.  Do not put any creams, lotions, or ointments on incision. Activity Walk each and every day, increasing distance each day. No lifting greater than 8 lbs.  Avoid bending, arching, and twisting. No driving for 2 weeks; may ride as a passenger locally.  Diet Resume your normal diet.  Return to Work Will be discussed at you follow up appointment. Call Your Doctor If Any of These Occur Redness, drainage, or swelling at the wound.  Temperature greater than 101 degrees. Severe pain not relieved by pain medication. Incision starts to come apart. Follow Up Appt Call [phone])  for problems.  If you have any hardware placed in your spine, you will need an x-ray before your appointment.  

## 2022-09-12 NOTE — Progress Notes (Signed)
Patient alert and oriented, voiding adequately, MAE well with no difficulty. Incision area cdi with no s/s of infection. Patient discharged home per order. Patient and Wife stated understanding of discharge instructions given. Patient has an appointment with Dr. Maurice Small.

## 2022-09-12 NOTE — Discharge Summary (Signed)
  Physician Discharge Summary  Patient ID: Theodore Meza MRN: [id number] DOB/AGE: 1990/10/28 32 y.o.  Admit date: 09/11/2022 Discharge date: 09/12/2022  Admission Diagnoses:  Lumbar spondylolisthesis  Discharge Diagnoses:  Same Principal Problem:   Spondylolisthesis of lumbar region   Discharged Condition: Stable  Hospital Course:  Theodore Meza is a 32 y.o. male who underwent L5-S1 MIS TLIF.  Postoperatively, he was admitted to the spine unit where he was mobilized with the aid of physical therapy.  He had appropriate postoperative back pain and improvement of his preoperative radicular symptoms.  He was deemed ready for discharge home on 09/12/2022.  Treatments: Surgery -L5-S1 MIS TLIF  Discharge Exam: Blood pressure 105/64, pulse (!) 57, temperature 98.1 F (36.7 C), temperature source Oral, resp. rate 20, height 6\' 3"  (1.905 m), weight 89.8 kg, SpO2 100 %. Awake, alert, oriented Speech fluent, appropriate CN grossly intact 5/5 BUE/BLE Wound c/d/i  Disposition: Discharge disposition: 01-Home or Self Care        Allergies as of 09/12/2022       Reactions   Dicyclomine Shortness Of Breath   Chest pain        Medication List     STOP taking these medications    ibuprofen 200 MG tablet Commonly known as: ADVIL   naproxen 375 MG tablet Commonly known as: NAPROSYN       TAKE these medications    cyclobenzaprine 10 MG tablet Commonly known as: FLEXERIL Take 1 tablet (10 mg total) by mouth 3 (three) times daily as needed for muscle spasms.   docusate sodium 100 MG capsule Commonly known as: Colace Take 1 capsule (100 mg total) by mouth 2 (two) times daily.   FLUoxetine 40 MG capsule Commonly known as: PROzac Take 1 capsule (40 mg total) by mouth daily.   hydrOXYzine 25 MG tablet Commonly known as: ATARAX Take 1 tablet (25 mg total) by mouth 3 (three) times daily as needed.   lamoTRIgine 25 MG tablet Commonly known as: LAMICTAL Take 25 mg by  mouth every evening.   loratadine 10 MG tablet Commonly known as: CLARITIN Take 10 mg by mouth daily.   oxyCODONE-acetaminophen 5-325 MG tablet Commonly known as: Percocet Take 1-2 tablets by mouth every 6 (six) hours as needed for severe pain. What changed: how much to take        Follow-up Information     Jadene Pierini, MD. Call.   Specialty: Neurosurgery Why: As needed, If symptoms worsen Contact information: [address] [place] [address] [phone]                 Signed: Bedelia Person 09/12/2022, 10:59 AM
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
